# Patient Record
Sex: Female | Born: 1965 | Race: Black or African American | Hispanic: No | Marital: Married | Smoking: Never smoker
Health system: Southern US, Community
[De-identification: ages and names within clinical notes are randomized; demographics above are authoritative.]

## PROBLEM LIST (undated history)

## (undated) DIAGNOSIS — I714 Abdominal aortic aneurysm, without rupture, unspecified: Secondary | ICD-10-CM

## (undated) DIAGNOSIS — I10 Essential (primary) hypertension: Secondary | ICD-10-CM

---

## 2014-08-02 ENCOUNTER — Encounter (HOSPITAL_BASED_OUTPATIENT_CLINIC_OR_DEPARTMENT_OTHER): Payer: Self-pay | Admitting: *Deleted

## 2014-08-02 ENCOUNTER — Emergency Department (HOSPITAL_BASED_OUTPATIENT_CLINIC_OR_DEPARTMENT_OTHER)
Admission: EM | Admit: 2014-08-02 | Discharge: 2014-08-02 | Disposition: A | Discharge status: Home / Self Care | Payer: Self-pay | Attending: Emergency Medicine | Admitting: Emergency Medicine

## 2014-08-02 DIAGNOSIS — R1030 Lower abdominal pain, unspecified: Secondary | ICD-10-CM

## 2014-08-02 DIAGNOSIS — M791 Myalgia: Secondary | ICD-10-CM | POA: Insufficient documentation

## 2014-08-02 DIAGNOSIS — R1031 Right lower quadrant pain: Secondary | ICD-10-CM | POA: Insufficient documentation

## 2014-08-02 DIAGNOSIS — I10 Essential (primary) hypertension: Secondary | ICD-10-CM | POA: Insufficient documentation

## 2014-08-02 HISTORY — DX: Essential (primary) hypertension: I10

## 2014-08-02 NOTE — ED Provider Notes (Signed)
CSN: [id number]     Arrival date & time 08/02/14  1648 History  This chart was scribed for Vanetta MuldersScott Calyn Rubi, MD by Roxy Cedarhandni Bhalodia, ED Scribe. This patient was seen in room MH05/MH05 and the patient's care was started at 5:30 PM.   Chief Complaint  Patient presents with  . Abdominal Pain   Patient is a 48 y.o. female presenting with abdominal pain. The history is provided by the patient. No language interpreter was used.  Abdominal Pain Pain location:  RLQ Pain quality: burning and sharp   Pain radiates to:  Does not radiate Pain severity:  Moderate Onset quality:  Gradual Duration:  1 hour Timing:  Constant Progression:  Unchanged Chronicity:  New Relieved by:  Nothing Worsened by:  Nothing tried Ineffective treatments:  None tried Associated symptoms: no chest pain, no chills, no diarrhea, no dysuria, no fever, no nausea, no shortness of breath, no sore throat and no vomiting    HPI Comments:  Kathleen Drake is a 48 y.o. female with a history of hypertension, brought in by parents to the Emergency Department complaining of sharp and burning RLQ abdominal pain that began 1 hour ago while outside watching a parade. She denies associated nausea, vomiting, diarrhea. She states that her symptoms have gradually improved since arriving to ER. She states that the pain began at 4:20pm and lasted until 4:45pm. She denies history of similar symptoms. She rates her pain at worst 10/10.  She states that the pain worsens with cough.  Past Medical History  Diagnosis Date  . Hypertension    History reviewed. No pertinent past surgical history. No family history on file. History  Substance Use Topics  . Smoking status: Never Smoker   . Smokeless tobacco: Not on file  . Alcohol Use: Yes   OB History    No data available     Review of Systems  Constitutional: Negative for fever and chills.  HENT: Negative for congestion, rhinorrhea and sore throat.   Eyes: Negative for visual  disturbance.  Respiratory: Negative for shortness of breath.   Cardiovascular: Negative for chest pain and leg swelling.  Gastrointestinal: Positive for abdominal pain. Negative for nausea, vomiting and diarrhea.  Genitourinary: Negative for dysuria.  Musculoskeletal: Positive for myalgias.  Neurological: Negative for headaches.  Hematological: Does not bruise/bleed easily.  Psychiatric/Behavioral: Negative for confusion.   Allergies  Review of patient's allergies indicates not on file.  Home Medications   Prior to Admission medications   Not on File   Triage Vitals: BP 141/93 mmHg  Pulse 66  Temp(Src) 97.9 F (36.6 C)  Resp 18  Ht 5\' 2"  (1.575 m)  Wt 155 lb (70.308 kg)  BMI 28.34 kg/m2  SpO2 100%  LMP 07/30/2014  Physical Exam  Constitutional: She is oriented to person, place, and time. She appears well-developed and well-nourished.  HENT:  Head: Normocephalic and atraumatic.  Mouth/Throat: Oropharynx is clear and moist.  Eyes: Pupils are equal, round, and reactive to light.  Cardiovascular: Normal rate, regular rhythm and normal heart sounds.   No murmur heard. Pulmonary/Chest: Effort normal and breath sounds normal. No respiratory distress.  Abdominal: Soft. Bowel sounds are normal. There is no tenderness.  RLQ abdominal pain  Musculoskeletal: She exhibits no edema.  Neurological: She is alert and oriented to person, place, and time. No cranial nerve deficit. She exhibits normal muscle tone. Coordination normal.  Psychiatric: She has a normal mood and affect.  Nursing note and vitals reviewed.  ED Course  Procedures (  including critical care time)  DIAGNOSTIC STUDIES: Oxygen Saturation is 100% on RA, normal by my interpretation.    COORDINATION OF CARE: 5:37 PM- Discussed plans to discharge. Advised patient to return if symptoms worsen and last for more than 1-2 hours. Pt advised of plan for treatment and pt agrees.  MDM   Final diagnoses:  Lower abdominal  pain    Patient with less than 45 minutes of right lower quadrant abdominal pain. It was intense now completely resolved. Examination nontender in the abdomen. No lab workup required. I guess it's possible that this could've been renal colic type pain. Patient may pass the stone or stopped moving. Patient will return for any recurrent pain. No workup required. Patient is nontoxic no acute distress.  I personally performed the services described in this documentation, which was scribed in my presence. The recorded information has been reviewed and is accurate.  Vanetta MuldersScott Jamariya Davidoff, MD 08/02/14 1742

## 2014-08-02 NOTE — ED Notes (Signed)
Reports sudden onset of RLQ abd pain at approx 1620 while outside watching parade. States pain made her double over- Pain resolved at this time- denies n/v/d

## 2014-08-02 NOTE — Discharge Instructions (Signed)
We are glad that the pain is resolved. Return for any recurrent pain lasting an hour or 2. Return for any new or worse symptoms.

## 2014-08-02 NOTE — ED Notes (Signed)
MD at bedside. 

## 2014-08-02 NOTE — ED Notes (Signed)
Pt reports onset of lower right abdominal pain about 1 hour, denies n/v/d.

## 2016-04-16 ENCOUNTER — Encounter (HOSPITAL_BASED_OUTPATIENT_CLINIC_OR_DEPARTMENT_OTHER): Payer: Self-pay | Admitting: *Deleted

## 2016-04-16 ENCOUNTER — Emergency Department (HOSPITAL_BASED_OUTPATIENT_CLINIC_OR_DEPARTMENT_OTHER)
Admission: EM | Admit: 2016-04-16 | Discharge: 2016-04-16 | Disposition: A | Discharge status: Home / Self Care | Payer: Self-pay | Attending: Emergency Medicine | Admitting: Emergency Medicine

## 2016-04-16 ENCOUNTER — Emergency Department (HOSPITAL_BASED_OUTPATIENT_CLINIC_OR_DEPARTMENT_OTHER): Payer: Self-pay

## 2016-04-16 DIAGNOSIS — R350 Frequency of micturition: Secondary | ICD-10-CM | POA: Insufficient documentation

## 2016-04-16 DIAGNOSIS — I10 Essential (primary) hypertension: Secondary | ICD-10-CM | POA: Insufficient documentation

## 2016-04-16 DIAGNOSIS — R1031 Right lower quadrant pain: Secondary | ICD-10-CM

## 2016-04-16 DIAGNOSIS — N730 Acute parametritis and pelvic cellulitis: Secondary | ICD-10-CM | POA: Insufficient documentation

## 2016-04-16 DIAGNOSIS — N7093 Salpingitis and oophoritis, unspecified: Secondary | ICD-10-CM | POA: Insufficient documentation

## 2016-04-16 DIAGNOSIS — R300 Dysuria: Secondary | ICD-10-CM | POA: Insufficient documentation

## 2016-04-16 DIAGNOSIS — R102 Pelvic and perineal pain: Secondary | ICD-10-CM

## 2016-04-16 LAB — URINALYSIS, ROUTINE W REFLEX MICROSCOPIC
Bilirubin Urine: NEGATIVE
GLUCOSE, UA: NEGATIVE mg/dL
HGB URINE DIPSTICK: NEGATIVE
Ketones, ur: 15 mg/dL — AB
Leukocytes, UA: NEGATIVE
Nitrite: NEGATIVE
PH: 5.5 (ref 5.0–8.0)
PROTEIN: NEGATIVE mg/dL
SPECIFIC GRAVITY, URINE: 1.027 (ref 1.005–1.030)

## 2016-04-16 LAB — COMPREHENSIVE METABOLIC PANEL
ALT: 9 U/L — ABNORMAL LOW (ref 14–54)
AST: 13 U/L — ABNORMAL LOW (ref 15–41)
Albumin: 3.5 g/dL (ref 3.5–5.0)
Alkaline Phosphatase: 65 U/L (ref 38–126)
Anion gap: 6 (ref 5–15)
BUN: 12 mg/dL (ref 6–20)
CO2: 28 mmol/L (ref 22–32)
Calcium: 8.8 mg/dL — ABNORMAL LOW (ref 8.9–10.3)
Chloride: 103 mmol/L (ref 101–111)
Creatinine, Ser: 0.98 mg/dL (ref 0.44–1.00)
GFR calc Af Amer: >60 mL/min (ref >60)
GFR calc non Af Amer: >60 mL/min (ref >60)
Glucose, Bld: 93 mg/dL (ref 65–99)
Potassium: 3.6 mmol/L (ref 3.5–5.1)
Sodium: 137 mmol/L (ref 135–145)
Total Bilirubin: 0.8 mg/dL (ref 0.3–1.2)
Total Protein: 6.9 g/dL (ref 6.5–8.1)

## 2016-04-16 LAB — CBC WITH DIFFERENTIAL/PLATELET
Basophils Absolute: 0 10*3/uL (ref 0.0–0.1)
Basophils Relative: 0 %
Eosinophils Absolute: 0.2 10*3/uL (ref 0.0–0.7)
Eosinophils Relative: 1 %
HCT: 38.5 % (ref 36.0–46.0)
Hemoglobin: 12.4 g/dL (ref 12.0–15.0)
Lymphocytes Relative: 17 %
Lymphs Abs: 2.4 10*3/uL (ref 0.7–4.0)
MCH: 28.1 pg (ref 26.0–34.0)
MCHC: 32.2 g/dL (ref 30.0–36.0)
MCV: 87.1 fL (ref 78.0–100.0)
Monocytes Absolute: 1.5 10*3/uL — ABNORMAL HIGH (ref 0.1–1.0)
Monocytes Relative: 11 %
Neutro Abs: 9.9 10*3/uL — ABNORMAL HIGH (ref 1.7–7.7)
Neutrophils Relative %: 71 %
Platelets: 331 10*3/uL (ref 150–400)
RBC: 4.42 MIL/uL (ref 3.87–5.11)
RDW: 14.5 % (ref 11.5–15.5)
WBC: 14 10*3/uL — ABNORMAL HIGH (ref 4.0–10.5)

## 2016-04-16 LAB — WET PREP, GENITAL
Sperm: NONE SEEN
Trich, Wet Prep: NONE SEEN
Yeast Wet Prep HPF POC: NONE SEEN

## 2016-04-16 MED ORDER — CEFTRIAXONE SODIUM 1 G IJ SOLR
1.0000 g | Freq: Once | INTRAMUSCULAR | Status: AC
  Administered 2016-04-16: 1 g via INTRAMUSCULAR
  Filled 2016-04-16: qty 10

## 2016-04-16 MED ORDER — CEFTRIAXONE SODIUM 250 MG IJ SOLR
250.0000 mg | Freq: Once | INTRAMUSCULAR | Status: AC
  Administered 2016-04-16: 250 mg via INTRAMUSCULAR
  Filled 2016-04-16: qty 250

## 2016-04-16 MED ORDER — METRONIDAZOLE 500 MG PO TABS: 500.0000 mg | ORAL_TABLET | Freq: Two times a day (BID) | ORAL | 0 refills | Status: DC

## 2016-04-16 MED ORDER — IOPAMIDOL (ISOVUE-300) INJECTION 61%
100.0000 mL | Freq: Once | INTRAVENOUS | Status: AC | PRN
  Administered 2016-04-16: 100 mL via INTRAVENOUS

## 2016-04-16 MED ORDER — DOXYCYCLINE HYCLATE 100 MG PO CAPS: 100.0000 mg | ORAL_CAPSULE | Freq: Two times a day (BID) | ORAL | 0 refills | Status: DC

## 2016-04-16 MED ORDER — LIDOCAINE HCL (PF) 1 % IJ SOLN
INTRAMUSCULAR | Status: AC
  Administered 2016-04-16: 2.1 mL
  Filled 2016-04-16: qty 5

## 2016-04-16 MED ORDER — CIPROFLOXACIN HCL 500 MG PO TABS: 500.0000 mg | ORAL_TABLET | Freq: Two times a day (BID) | ORAL | 0 refills | Status: DC

## 2016-04-16 MED ORDER — LIDOCAINE HCL (PF) 1 % IJ SOLN
INTRAMUSCULAR | Status: AC
  Administered 2016-04-16: 0.9 mL
  Filled 2016-04-16: qty 5

## 2016-04-16 NOTE — ED Provider Notes (Signed)
[facility]   CSN: [id number] Arrival date & time: 04/16/16  1132  First Provider Contact:  First MD Initiated Contact with Patient 04/16/16 1158        History   Chief Complaint Chief Complaint  Patient presents with  . Urinary Tract Infection    HPI Kathleen Drake is a 50 y.o. female who presents with a 1 day history of urinary frequency, urgency, suprapubic pain, dysuria, dyspareunia, right lower quadrant and periumbilical pain. Patient reports her abdominal pain as a constant dull, burning sensation with intermittent sharp pains. Patient states her symptoms again around noon yesterday and gradually worsened. Patient did not take any medications at home for this. Patient is sexually active with her husband. She does not have any specifics concern for STD, however she states it is not out of the question. Patient denies any fevers, chest pain, shortness of breath, nausea, vomiting, hematuria. Patient is in menopause.  HPI  Past Medical History:  Diagnosis Date  . Hypertension    Pt denies, is not treated    There are no active problems to display for this patient.   History reviewed. No pertinent surgical history.  OB History    No data available       Home Medications    Prior to Admission medications   Not on File    Family History No family history on file.  Social History Social History  Substance Use Topics  . Smoking status: Never Smoker  . Smokeless tobacco: Never Used  . Alcohol use Yes     Allergies   Review of patient's allergies indicates no known allergies.   Review of Systems Review of Systems  Constitutional: Negative for chills and fever.  HENT: Negative for facial swelling.   Respiratory: Negative for shortness of breath.   Cardiovascular: Negative for chest pain.  Gastrointestinal: Positive for abdominal pain. Negative for blood in stool, nausea and vomiting.  Genitourinary: Positive for dyspareunia,  dysuria, frequency and urgency. Negative for hematuria, vaginal bleeding and vaginal discharge.  Musculoskeletal: Negative for back pain.  Skin: Negative for rash and wound.  Neurological: Negative for headaches.  Psychiatric/Behavioral: The patient is not nervous/anxious.      Physical Exam Updated Vital Signs BP 126/93   Pulse 75   Temp 98.4 F (36.9 C) (Oral)   Resp 18   Ht  (1.575 m)   Wt 74.8 kg   LMP 07/30/2014   SpO2 100%   BMI 30.18 kg/m   Physical Exam  Constitutional: She appears well-developed and well-nourished. No distress.  HENT:  Head: Normocephalic and atraumatic.  Mouth/Throat: Oropharynx is clear and moist. No oropharyngeal exudate.  Eyes: Conjunctivae are normal. Pupils are equal, round, and reactive to light. Right eye exhibits no discharge. Left eye exhibits no discharge. No scleral icterus.  Neck: Normal range of motion. Neck supple. No thyromegaly present.  Cardiovascular: Normal rate, regular rhythm, normal heart sounds and intact distal pulses.  Exam reveals no gallop and no friction rub.   No murmur heard. Pulmonary/Chest: Effort normal and breath sounds normal. No stridor. No respiratory distress. She has no wheezes. She has no rales.  Abdominal: Soft. Bowel sounds are normal. She exhibits no distension. There is tenderness in the right lower quadrant, periumbilical area and suprapubic area. There is tenderness at McBurney's point. There is no rebound, no guarding, no CVA tenderness and negative Murphy's sign.    Genitourinary: Pelvic exam was performed with patient prone. There is no  rash, tenderness or lesion on the right labia. There is no rash, tenderness or lesion on the left labia. Cervix exhibits motion tenderness and discharge (scant). Right adnexum displays tenderness. Right adnexum displays no mass and no fullness. Left adnexum displays no mass, no tenderness and no fullness. There is tenderness in the vagina. No bleeding in the vagina.  Vaginal discharge (scant) found.  Genitourinary Comments: Chaperone present; pain with speculum insertion  Musculoskeletal: She exhibits no edema.  Lymphadenopathy:    She has no cervical adenopathy.  Neurological: She is alert. Coordination normal.  Skin: Skin is warm and dry. No rash noted. She is not diaphoretic. No pallor.  Psychiatric: She has a normal mood and affect.  Nursing note and vitals reviewed.    ED Treatments / Results  Labs (all labs ordered are listed, but only abnormal results are displayed) Labs Reviewed  WET PREP, GENITAL - Abnormal; Notable for the following:       Result Value   Clue Cells Wet Prep HPF POC PRESENT (*)    WBC, Wet Prep HPF POC MODERATE (*)    All other components within normal limits  URINALYSIS, ROUTINE W REFLEX MICROSCOPIC (NOT AT [facility]) - Abnormal; Notable for the following:    Color, Urine AMBER (*)    APPearance CLOUDY (*)    Ketones, ur 15 (*)    All other components within normal limits  COMPREHENSIVE METABOLIC PANEL - Abnormal; Notable for the following:    Calcium 8.8 (*)    AST 13 (*)    ALT 9 (*)    All other components within normal limits  CBC WITH DIFFERENTIAL/PLATELET - Abnormal; Notable for the following:    WBC 14.0 (*)    Neutro Abs 9.9 (*)    Monocytes Absolute 1.5 (*)    All other components within normal limits  URINE CULTURE  RPR  HIV ANTIBODY (ROUTINE TESTING)  GC/CHLAMYDIA PROBE AMP ([place]) NOT AT [facility]    EKG  EKG Interpretation None       Radiology Ct Abdomen Pelvis W Contrast  Result Date: 04/16/2016 CLINICAL DATA:  Right lower quadrant pain with nausea. EXAM: CT ABDOMEN AND PELVIS WITH CONTRAST TECHNIQUE: Multidetector CT imaging of the abdomen and pelvis was performed using the standard protocol following bolus administration of intravenous contrast. CONTRAST:  ISOVUE-300 IOPAMIDOL (ISOVUE-300) INJECTION 61% COMPARISON:  None. FINDINGS: There is a 4 mm nodule in the right lung on series 5,  image 4. A smaller nodule is suspected on series 5, image 5 on the left. The lung bases are otherwise within normal limits. No free air.  There is a small amount of free fluid in the pelvis. The gallbladder is decompressed limiting evaluation. A low-attenuation lesion in the right hepatic lobe is almost certainly a cyst but too small to characterize. The liver and portal vein are otherwise normal. The spleen, adrenal glands, and pancreas are normal. The kidneys demonstrate no abnormalities. The abdominal aorta is non aneurysmal with no atherosclerotic change. No adenopathy. The stomach and small bowel are normal. The colon and appendix are normal. Probable fibroids in the uterus. A high attenuation mass projecting posteriorly off the uterus on series 3, image 76 is likely a fibroid. Several small follicles in the left ovary. There is some fluid in the pelvis. No definitive right ovarian mass. Visualized bones are unremarkable. IMPRESSION: 1. There is a small amount of fluid in the pelvis, likely physiologic. It is possible the patient's pain is adnexal in origin.  If there is concern, an ultrasound could better evaluate as clinically warranted. 2. Probable fibroid uterus. 3. The appendix is normal. No other cause for right lower quadrant pain is identified. 4. 4 mm nodule in the right lung. See below for follow-up recommendations. No follow-up needed if patient is low-risk. Non-contrast chest CT can be considered in 12 months if patient is high-risk. This recommendation follows the consensus statement: Guidelines for Management of Incidental Pulmonary Nodules Detected on CT Images:From the Fleischner Society 2017; published online before print (10.1148/[id number]). Electronically Signed   By: Gerome Samavid  Williams III M.D   On: 04/16/2016 16:00    Procedures Procedures (including critical care time)  Medications Ordered in ED Medications  cefTRIAXone (ROCEPHIN) injection 250 mg (not administered)  lidocaine  (PF) (XYLOCAINE) 1 % injection (not administered)  iopamidol (ISOVUE-300) 61 % injection 100 mL (100 mLs Intravenous Contrast Given 04/16/16 1528)     Initial Impression / Assessment and Plan / ED Course  I have reviewed the triage vital signs and the nursing notes.  Pertinent labs & imaging results that were available during my care of the patient were reviewed by me and considered in my medical decision making (see chart for details).  Clinical Course    CT abdomen pelvis ordered due to positive Rovsing's and right lower quadrant pain, elevated white blood cell count. Pelvic ultrasound suggested by radiologist and will order a pelvic ultrasound.  Final Clinical Impressions(s) / ED Diagnoses   Final diagnoses:  Urinary frequency  Suprapubic pain  Right adnexal tenderness    CBC shows WBC 14; neutrophil 9.9. CMP shows AST 13, ALT 9. UA negative for infection, 15 ketones. Urine culture sent and would treat if culture positive. Wet prep shows moderate white blood cells, clue cells. CT abdomen and pelvis shows [IMPRESSION: 1. There is a small amount of fluid in the pelvis, likely physiologic. It is possible the patient's pain is adnexal in origin. If there is concern, an ultrasound could better evaluate as clinically warranted. 2. Probable fibroid uterus. 3. The appendix is normal. No other cause for right lower quadrant pain is identified. 4. 4 mm nodule in the right lung. See below for follow-up recommendations. No follow-up needed if patient is low-risk. Non-contrast chest CT can be considered in 12 months if patient is high-risk.] Patient never smoker indicating patient is low risk. Pelvic ultrasound pending. Due to patient pelvic exam, I will treat patient for PID. Patient given Rocephin in ED. Patient to be discharged with doxycycline. At shift change, [facility] Laisure, PA-C for continued evaluation, follow up of pelvic ultrasound and determination of disposition.  Patient to follow-up with OB/GYN for further evaluation and follow-up. Patient has an appointment this month as scheduled. Patient afebrile, vital stable throughout ED course. I discussed this case with Dr. Fredderick PhenixBelfi who noted the patient's management and is in agreement with plan. Patient understands and agrees with plan.    New Prescriptions New Prescriptions   No medications on file     Emi Holeslexandra M Rafaella Kole, PA-C 04/16/16 1754    Rolan BuccoMelanie Belfi, MD 04/17/16 0700

## 2016-04-16 NOTE — ED Notes (Signed)
PA at bedside.

## 2016-04-16 NOTE — ED Notes (Signed)
Patient transported to Ultrasound 

## 2016-04-16 NOTE — Discharge Instructions (Addendum)
Medications: Cipro and Flagyl  Treatment: You are being treated for suspected pelvic inflammatory disease. Take doxycycline twice daily for 2 weeks. You will be called in 2-3 days if any of your results return positive. If your positive for HIV or syphilis, please seek treatment of your OB/GYN or health department. You will also be called in 2-3 days if your urine culture is positive. Treatment will be arranged at that time.  Follow-up: Please follow-up with your OB/GYN as soon as possible for follow-up and further evaluation and treatment. Please return to emergency department if he develop any new or worsening symptoms.

## 2016-04-16 NOTE — ED Triage Notes (Signed)
Patient states she has a two day history of urinary urgency, frequency, and stinging with urination. Also has pain in the LLQ and umbilicus area as well.

## 2016-04-16 NOTE — ED Provider Notes (Signed)
5:00 PM Patient signed out to me by Buel ReamAlexandra Law, PA-C.  Patient presents today with RLQ abdominal pain.  CT was ordered to rule out an Appendicitis.  CT negative for Appendicitis, but did show changes consistent with PID.  Pelvic ultrasound ordered.  Results pending.    6:30 PM Pelvic ultrasound showing PID and Pyosalpinx bilaterally.  Will consult OB/GYN  8:00 PM Discussed with Dr Despina HiddenEure with OB/GYN.  He recommends giving the patient a gram IM Rocephin and Cipro and Flagyl 500 mg BID x 14 days.    Patient given prescriptions and IM Rocephin and instructed to follow up with OB/GYN outpatient.      Santiago GladHeather Norely Schlick, PA-C 04/18/16 2145    Lavera Guiseana Duo Liu, MD 04/19/16 [phone]

## 2016-04-16 NOTE — ED Notes (Signed)
Patient transported to CT 

## 2016-04-17 LAB — URINE CULTURE

## 2016-04-17 LAB — RPR: RPR Ser Ql: NONREACTIVE

## 2016-04-17 LAB — HIV ANTIBODY (ROUTINE TESTING W REFLEX): HIV Screen 4th Generation wRfx: NONREACTIVE

## 2016-04-17 LAB — GC/CHLAMYDIA PROBE AMP (~~LOC~~) NOT AT ARMC
Chlamydia: NEGATIVE
Neisseria Gonorrhea: NEGATIVE

## 2016-11-05 ENCOUNTER — Encounter (HOSPITAL_BASED_OUTPATIENT_CLINIC_OR_DEPARTMENT_OTHER): Payer: Self-pay | Admitting: Emergency Medicine

## 2016-11-05 ENCOUNTER — Emergency Department (HOSPITAL_BASED_OUTPATIENT_CLINIC_OR_DEPARTMENT_OTHER)
Admission: EM | Admit: 2016-11-05 | Discharge: 2016-11-05 | Disposition: A | Discharge status: Home / Self Care | Payer: Self-pay | Attending: Emergency Medicine | Admitting: Emergency Medicine

## 2016-11-05 ENCOUNTER — Emergency Department (HOSPITAL_BASED_OUTPATIENT_CLINIC_OR_DEPARTMENT_OTHER): Payer: Self-pay

## 2016-11-05 DIAGNOSIS — Z79899 Other long term (current) drug therapy: Secondary | ICD-10-CM | POA: Insufficient documentation

## 2016-11-05 DIAGNOSIS — Y939 Activity, unspecified: Secondary | ICD-10-CM | POA: Insufficient documentation

## 2016-11-05 DIAGNOSIS — I10 Essential (primary) hypertension: Secondary | ICD-10-CM | POA: Insufficient documentation

## 2016-11-05 DIAGNOSIS — M7021 Olecranon bursitis, right elbow: Secondary | ICD-10-CM | POA: Insufficient documentation

## 2016-11-05 MED ORDER — DICLOFENAC SODIUM 1 % TD GEL: 2.0000 g | Freq: Four times a day (QID) | TRANSDERMAL | 0 refills | Status: DC

## 2016-11-05 MED ORDER — IBUPROFEN 400 MG PO TABS
600.0000 mg | ORAL_TABLET | Freq: Once | ORAL | Status: AC
  Administered 2016-11-05: 600 mg via ORAL
  Filled 2016-11-05: qty 1

## 2016-11-05 MED ORDER — HYDROCODONE-ACETAMINOPHEN 5-325 MG PO TABS: 1.0000 | ORAL_TABLET | Freq: Four times a day (QID) | ORAL | 0 refills | Status: DC | PRN

## 2016-11-05 NOTE — ED Provider Notes (Signed)
[facility] DEPT Provider Note   CSN: [id number] Arrival date & time: 11/05/16  1112     History   Chief Complaint Chief Complaint  Patient presents with  . Insect Bite    HPI Kathleen Drake is a 51 y.o. female who presents to the ED with swelling of the right elbow that started a few days ago as a single tinder spot and this morning had swelling and increased tenderness to the elbow. Patient reports she first noted the tenderness when she was sitting propped on her elbow a few days ago. She does not remember any injury to the area. She thought at first maybe something bit her. No fever, n/v or other problems.  HPI  Past Medical History:  Diagnosis Date  . Hypertension    Pt denies, is not treated    There are no active problems to display for this patient.   History reviewed. No pertinent surgical history.  OB History    No data available       Home Medications    Prior to Admission medications   Medication Sig Start Date End Date Taking? Authorizing Provider  ciprofloxacin (CIPRO) 500 MG tablet Take 1 tablet (500 mg total) by mouth 2 (two) times daily. 04/16/16   Heather Laisure, PA-C  diclofenac sodium (VOLTAREN) 1 % GEL Apply 2 g topically 4 (four) times daily. 11/05/16   Erandi Lemma Orlene Och, NP  doxycycline (VIBRAMYCIN) 100 MG capsule Take 1 capsule (100 mg total) by mouth 2 (two) times daily. 04/16/16   Emi Holes, PA-C  HYDROcodone-acetaminophen (NORCO/VICODIN) 5-325 MG tablet Take 1 tablet by mouth every 6 (six) hours as needed. 11/05/16   Clela Hagadorn Orlene Och, NP  metroNIDAZOLE (FLAGYL) 500 MG tablet Take 1 tablet (500 mg total) by mouth 2 (two) times daily. 04/16/16   Santiago Glad, PA-C    Family History No family history on file.  Social History Social History  Substance Use Topics  . Smoking status: Never Smoker  . Smokeless tobacco: Never Used  . Alcohol use Yes     Comment: socially     Allergies   Patient has no known allergies.   Review of  Systems Review of Systems Negative except as stated in HPI  Physical Exam Updated Vital Signs BP (!) 144/108 (BP Location: Right Arm) Comment: pt has not taken BP meds today-will take when she gets home.  Pulse 68   Temp 98 F (36.7 C) (Oral)   Resp 18   Ht 5\' 2"  (1.575 m)   Wt 76.7 kg   LMP 07/30/2014   SpO2 100%   BMI 30.91 kg/m   Physical Exam  Constitutional: She appears well-developed and well-nourished. No distress.  HENT:  Head: Normocephalic.  Eyes: EOM are normal.  Neck: Neck supple.  Cardiovascular: Normal rate.   Pulmonary/Chest: Effort normal.  Abdominal: Soft. There is no tenderness.  Musculoskeletal:       Right elbow: She exhibits swelling. She exhibits no laceration. Tenderness found. Olecranon process tenderness noted.  There is swelling and tenderness over the Olecranon process. Radial pulse 2+, adequate circulation.   Neurological: She is alert.  Skin: Skin is warm and dry.  Psychiatric: She has a normal mood and affect. Her behavior is normal.  Nursing note and vitals reviewed.    ED Treatments / Results  Labs (all labs ordered are listed, but only abnormal results are displayed) Labs Reviewed - No data to display   Radiology Dg Elbow Complete Right  Result Date:  11/05/2016 CLINICAL DATA:  Elbow pain.  No known injury. EXAM: RIGHT ELBOW - COMPLETE 3+ VIEW COMPARISON:  None. FINDINGS: There is no acute fracture or subluxation. There is no joint effusion. Spurring at the coronoid process is identified. IMPRESSION: No acute findings. Mild degenerative change. Electronically Signed   By: Signa Kellaylor  Stroud M.D.   On: 11/05/2016 13:53    Procedures Procedures (including critical care time)  Medications Ordered in ED Medications  ibuprofen (ADVIL,MOTRIN) tablet 600 mg (600 mg Oral Given 11/05/16 1328)     Initial Impression / Assessment and Plan / ED Course  I have reviewed the triage vital signs and the nursing notes.  Pertinent  imaging results  that were available during my care of the patient were reviewed by me and considered in my medical decision making (see chart for details).   Final Clinical Impressions(s) / ED Diagnoses  51 y.o. female with swelling and tenderness of the right elbow stable for d/c without fracture or dislocation on x-ray. Will treat for bursitis and she will f/u with ortho if symptoms persist.  Final diagnoses:  Olecranon bursitis of right elbow    New Prescriptions Discharge Medication List as of 11/05/2016  2:10 PM    START taking these medications   Details  diclofenac sodium (VOLTAREN) 1 % GEL Apply 2 g topically 4 (four) times daily., Starting Sun 11/05/2016, Print    HYDROcodone-acetaminophen (NORCO/VICODIN) 5-325 MG tablet Take 1 tablet by mouth every 6 (six) hours as needed., Starting Sun 11/05/2016, Print         [name], NP 11/06/16 2053    Arby BarretteMarcy Pfeiffer, MD 11/15/16 [phone]

## 2016-11-05 NOTE — ED Notes (Signed)
Called x1 in waiting room no answer 

## 2016-11-05 NOTE — ED Notes (Signed)
Pt updated on the wait.

## 2016-11-05 NOTE — Discharge Instructions (Signed)
Wear the ace wrap to help with swelling, apply the cream for inflammation and take the pain medication as directed. Do not drive when taking the narcotic as it will make you sleepy. Follow up with your doctor or return here as needed.

## 2017-04-20 ENCOUNTER — Emergency Department (HOSPITAL_BASED_OUTPATIENT_CLINIC_OR_DEPARTMENT_OTHER)
Admission: EM | Admit: 2017-04-20 | Discharge: 2017-04-20 | Disposition: A | Discharge status: Home / Self Care | Payer: Self-pay | Attending: Emergency Medicine | Admitting: Emergency Medicine

## 2017-04-20 ENCOUNTER — Encounter (HOSPITAL_BASED_OUTPATIENT_CLINIC_OR_DEPARTMENT_OTHER): Payer: Self-pay | Admitting: *Deleted

## 2017-04-20 DIAGNOSIS — J020 Streptococcal pharyngitis: Secondary | ICD-10-CM | POA: Insufficient documentation

## 2017-04-20 DIAGNOSIS — I10 Essential (primary) hypertension: Secondary | ICD-10-CM | POA: Insufficient documentation

## 2017-04-20 LAB — RAPID STREP SCREEN (MED CTR MEBANE ONLY): Streptococcus, Group A Screen (Direct): POSITIVE — AB

## 2017-04-20 MED ORDER — ACETAMINOPHEN 325 MG PO TABS
650.0000 mg | ORAL_TABLET | Freq: Once | ORAL | Status: AC
  Administered 2017-04-20: 650 mg via ORAL
  Filled 2017-04-20: qty 2

## 2017-04-20 MED ORDER — DEXAMETHASONE SODIUM PHOSPHATE 10 MG/ML IJ SOLN
10.0000 mg | Freq: Once | INTRAMUSCULAR | Status: AC
  Administered 2017-04-20: 10 mg via INTRAMUSCULAR
  Filled 2017-04-20: qty 1

## 2017-04-20 MED ORDER — IBUPROFEN 800 MG PO TABS
800.0000 mg | ORAL_TABLET | Freq: Once | ORAL | Status: AC
  Administered 2017-04-20: 800 mg via ORAL
  Filled 2017-04-20: qty 1

## 2017-04-20 MED ORDER — IBUPROFEN 600 MG PO TABS
600.0000 mg | ORAL_TABLET | Freq: Four times a day (QID) | ORAL | 0 refills | Status: AC | PRN
Start: 2017-04-20 — End: ?

## 2017-04-20 MED ORDER — HYDROCODONE-ACETAMINOPHEN 5-325 MG PO TABS: 1.0000 | ORAL_TABLET | ORAL | 0 refills | Status: AC | PRN

## 2017-04-20 MED ORDER — PENICILLIN G BENZATHINE 1200000 UNIT/2ML IM SUSP
1.2000 10*6.[IU] | Freq: Once | INTRAMUSCULAR | Status: AC
Start: 2017-04-20 — End: 2017-04-20
  Administered 2017-04-20: 1.2 10*6.[IU] via INTRAMUSCULAR
  Filled 2017-04-20: qty 2

## 2017-04-20 NOTE — ED Triage Notes (Signed)
Sore throat, fever, left sided CP radiating down her arm x 1 week and HA.

## 2017-04-20 NOTE — ED Notes (Signed)
Pt teaching provided on medications that may cause drowsiness. Pt instructed not to drive or operate heavy machinery while taking the prescribed medication. Pt verbalized understanding.   

## 2017-04-20 NOTE — ED Provider Notes (Signed)
[facility] Provider Note   CSN: [id number] Arrival date & time: 04/20/17  2104     History   Chief Complaint Chief Complaint  Patient presents with  . Sore Throat    HPI Kathleen Drake is a 51 y.o. female.  Pt presents to the ED today with a sore throat.  She was exposed to a niece who had strep.  She has had a fever.  She did not take any meds for fever or for pain.      Past Medical History:  Diagnosis Date  . Hypertension    Pt denies, is not treated    There are no active problems to display for this patient.   History reviewed. No pertinent surgical history.  OB History    No data available       Home Medications    Prior to Admission medications   Medication Sig Start Date End Date Taking? Authorizing Provider  HYDROcodone-acetaminophen (NORCO/VICODIN) 5-325 MG tablet Take 1 tablet by mouth every 4 (four) hours as needed. 04/20/17   Jacalyn LefevreHaviland, Lyndi Holbein, MD  ibuprofen (ADVIL,MOTRIN) 600 MG tablet Take 1 tablet (600 mg total) by mouth every 6 (six) hours as needed. 04/20/17   Jacalyn LefevreHaviland, Tamsen Reist, MD    Family History History reviewed. No pertinent family history.  Social History Social History  Substance Use Topics  . Smoking status: Never Smoker  . Smokeless tobacco: Never Used  . Alcohol use Yes     Comment: socially     Allergies   Patient has no known allergies.   Review of Systems Review of Systems  Constitutional: Positive for fever.  HENT: Positive for sore throat.   All other systems reviewed and are negative.    Physical Exam Updated Vital Signs BP (!) 148/110 (BP Location: Left Arm)   Pulse (!) 104   Temp (!) 100.6 F (38.1 C) (Oral)   Resp 18   Ht 5\' 2"  (1.575 m)   Wt 78 kg (172 lb)   LMP 07/30/2014   SpO2 96%   BMI 31.46 kg/m   Physical Exam  Constitutional: She is oriented to person, place, and time. She appears well-developed. She appears distressed.  HENT:  Head: Normocephalic and atraumatic.  Right  Ear: External ear normal.  Left Ear: External ear normal.  Nose: Nose normal.  Mouth/Throat: Oropharyngeal exudate and posterior oropharyngeal erythema present.  Eyes: Pupils are equal, round, and reactive to light. Conjunctivae and EOM are normal.  Neck: Normal range of motion. Neck supple.  Cardiovascular: Regular rhythm, normal heart sounds and intact distal pulses.  Tachycardia present.   Pulmonary/Chest: Effort normal and breath sounds normal.  Abdominal: Soft. Bowel sounds are normal.  Musculoskeletal: Normal range of motion.  Neurological: She is alert and oriented to person, place, and time.  Skin: Skin is warm. Capillary refill takes less than 2 seconds.  Psychiatric: She has a normal mood and affect. Her behavior is normal. Judgment and thought content normal.  Nursing note and vitals reviewed.    ED Treatments / Results  Labs (all labs ordered are listed, but only abnormal results are displayed) Labs Reviewed  RAPID STREP SCREEN (NOT AT [facility]) - Abnormal; Notable for the following:       Result Value   Streptococcus, Group A Screen (Direct) POSITIVE (*)    All other components within normal limits    EKG  EKG Interpretation  Date/Time:  Friday April 20 2017 21:25:14 EDT Ventricular Rate:  91 PR Interval:  136  QRS Duration: 78 QT Interval:  326 QTC Calculation: 400 R Axis:   45 Text Interpretation:  Normal sinus rhythm Biatrial enlargement Nonspecific ST abnormality Abnormal ECG No old tracing to compare Confirmed by Jacalyn Lefevre [phone]) on 04/20/2017 9:49:29 PM       Radiology No results found.  Procedures Procedures (including critical care time)  Medications Ordered in ED Medications  ibuprofen (ADVIL,MOTRIN) tablet 800 mg (not administered)  dexamethasone (DECADRON) injection 10 mg (not administered)  penicillin g benzathine (BICILLIN LA) 1200000 UNIT/2ML injection 1.2 Million Units (not administered)  acetaminophen (TYLENOL) tablet 650 mg (650 mg  Oral Given 04/20/17 2132)     Initial Impression / Assessment and Plan / ED Course  I have reviewed the triage vital signs and the nursing notes.  Pertinent labs & imaging results that were available during my care of the patient were reviewed by me and considered in my medical decision making (see chart for details).    Pt requests IM penicillin.  She knows to return if worse.  BP is high.  She has chronic, untreated htn.  She is given the number to [facility] to establish pcp.  She knows to return if worse.  Final Clinical Impressions(s) / ED Diagnoses   Final diagnoses:  Strep throat  Essential hypertension    New Prescriptions New Prescriptions   HYDROCODONE-ACETAMINOPHEN (NORCO/VICODIN) 5-325 MG TABLET    Take 1 tablet by mouth every 4 (four) hours as needed.   IBUPROFEN (ADVIL,MOTRIN) 600 MG TABLET    Take 1 tablet (600 mg total) by mouth every 6 (six) hours as needed.     Jacalyn Lefevre, MD 04/20/17 2238

## 2018-01-17 ENCOUNTER — Emergency Department (HOSPITAL_BASED_OUTPATIENT_CLINIC_OR_DEPARTMENT_OTHER)
Admission: EM | Admit: 2018-01-17 | Discharge: 2018-01-17 | Disposition: A | Discharge status: Home / Self Care | Payer: Self-pay | Attending: Emergency Medicine | Admitting: Emergency Medicine

## 2018-01-17 ENCOUNTER — Encounter (HOSPITAL_BASED_OUTPATIENT_CLINIC_OR_DEPARTMENT_OTHER): Payer: Self-pay | Admitting: Emergency Medicine

## 2018-01-17 ENCOUNTER — Other Ambulatory Visit: Payer: Self-pay

## 2018-01-17 DIAGNOSIS — J029 Acute pharyngitis, unspecified: Secondary | ICD-10-CM | POA: Insufficient documentation

## 2018-01-17 DIAGNOSIS — L989 Disorder of the skin and subcutaneous tissue, unspecified: Secondary | ICD-10-CM | POA: Insufficient documentation

## 2018-01-17 DIAGNOSIS — I10 Essential (primary) hypertension: Secondary | ICD-10-CM | POA: Insufficient documentation

## 2018-01-17 DIAGNOSIS — R1031 Right lower quadrant pain: Secondary | ICD-10-CM | POA: Insufficient documentation

## 2018-01-17 LAB — URINALYSIS, ROUTINE W REFLEX MICROSCOPIC
Glucose, UA: NEGATIVE mg/dL
Ketones, ur: 15 mg/dL — AB
LEUKOCYTES UA: NEGATIVE
NITRITE: NEGATIVE
PROTEIN: NEGATIVE mg/dL
Specific Gravity, Urine: 1.025 (ref 1.005–1.030)
pH: 6 (ref 5.0–8.0)

## 2018-01-17 LAB — CBC WITH DIFFERENTIAL/PLATELET
BASOS ABS: 0 10*3/uL (ref 0.0–0.1)
BASOS PCT: 0 %
Eosinophils Absolute: 0.5 10*3/uL (ref 0.0–0.7)
Eosinophils Relative: 3 %
HEMATOCRIT: 37.5 % (ref 36.0–46.0)
Hemoglobin: 12.3 g/dL (ref 12.0–15.0)
LYMPHS ABS: 2.1 10*3/uL (ref 0.7–4.0)
Lymphocytes Relative: 13 %
MCH: 28.7 pg (ref 26.0–34.0)
MCHC: 32.8 g/dL (ref 30.0–36.0)
MCV: 87.4 fL (ref 78.0–100.0)
Monocytes Absolute: 1.3 10*3/uL — ABNORMAL HIGH (ref 0.1–1.0)
Monocytes Relative: 8 %
NEUTROS ABS: 12 10*3/uL — AB (ref 1.7–7.7)
Neutrophils Relative %: 76 %
Platelets: 386 10*3/uL (ref 150–400)
RBC: 4.29 MIL/uL (ref 3.87–5.11)
RDW: 14.1 % (ref 11.5–15.5)
WBC: 15.9 10*3/uL — ABNORMAL HIGH (ref 4.0–10.5)

## 2018-01-17 LAB — COMPREHENSIVE METABOLIC PANEL
ALT: 12 U/L — ABNORMAL LOW (ref 14–54)
AST: 24 U/L (ref 15–41)
Albumin: 3.1 g/dL — ABNORMAL LOW (ref 3.5–5.0)
Alkaline Phosphatase: 77 U/L (ref 38–126)
Anion gap: 11 (ref 5–15)
BILIRUBIN TOTAL: 0.6 mg/dL (ref 0.3–1.2)
BUN: 13 mg/dL (ref 6–20)
CO2: 23 mmol/L (ref 22–32)
Calcium: 8.7 mg/dL — ABNORMAL LOW (ref 8.9–10.3)
Chloride: 104 mmol/L (ref 101–111)
Creatinine, Ser: 0.88 mg/dL (ref 0.44–1.00)
Glucose, Bld: 129 mg/dL — ABNORMAL HIGH (ref 65–99)
POTASSIUM: 4 mmol/L (ref 3.5–5.1)
Sodium: 138 mmol/L (ref 135–145)
TOTAL PROTEIN: 6.6 g/dL (ref 6.5–8.1)

## 2018-01-17 LAB — URINALYSIS, MICROSCOPIC (REFLEX)

## 2018-01-17 MED ORDER — PENICILLIN G BENZATHINE 1200000 UNIT/2ML IM SUSP
1.2000 10*6.[IU] | Freq: Once | INTRAMUSCULAR | Status: AC
  Administered 2018-01-17: 1.2 10*6.[IU] via INTRAMUSCULAR
  Filled 2018-01-17: qty 2

## 2018-01-17 MED ORDER — CLINDAMYCIN HCL 150 MG PO CAPS: 300.0000 mg | ORAL_CAPSULE | Freq: Four times a day (QID) | ORAL | 0 refills | Status: DC

## 2018-01-17 MED ORDER — DEXAMETHASONE SODIUM PHOSPHATE 10 MG/ML IJ SOLN
10.0000 mg | Freq: Once | INTRAMUSCULAR | Status: AC
  Administered 2018-01-17: 10 mg via INTRAVENOUS
  Filled 2018-01-17: qty 1

## 2018-01-17 MED ORDER — KETOROLAC TROMETHAMINE 15 MG/ML IJ SOLN
15.0000 mg | Freq: Once | INTRAMUSCULAR | Status: AC
  Administered 2018-01-17: 15 mg via INTRAVENOUS
  Filled 2018-01-17: qty 1

## 2018-01-17 NOTE — ED Triage Notes (Addendum)
Sore throat x 1 week. Also c/o RLQ pain and states it hurts worse after standing for a long period of time. Pt smoked cocaine 4 days ago.

## 2018-01-17 NOTE — ED Provider Notes (Signed)
[facility] EMERGENCY DEPARTMENT Provider Note   CSN: [id number] Arrival date & time: 01/17/18  1539     History   Chief Complaint Chief Complaint  Patient presents with  . Sore Throat  . Abdominal Pain    HPI Kathleen Drake is a 52 y.o. female.  Patient with history of cocaine use presents today with complaint of sore throat and trouble swallowing due to pain over the past 6 days.  Patient denies fever, chest pain, shortness of breath.  No ear pain or nasal congestion.  No known sick contacts.  More chronically the patient has also had intermittent right lower quadrant pains.  These have been more prevalent over the past week and are worse with standing for long periods of time.  No associated nausea, vomiting, diarrhea.  No dysuria.  No vaginal bleeding or discharge.  No treatments prior to arrival.     Past Medical History:  Diagnosis Date  . Hypertension    Pt denies, is not treated    There are no active problems to display for this patient.   History reviewed. No pertinent surgical history.   OB History   None      Home Medications    Prior to Admission medications   Medication Sig Start Date End Date Taking? Authorizing Provider  HYDROcodone-acetaminophen (NORCO/VICODIN) 5-325 MG tablet Take 1 tablet by mouth every 4 (four) hours as needed. 04/20/17   Jacalyn Lefevre, MD  ibuprofen (ADVIL,MOTRIN) 600 MG tablet Take 1 tablet (600 mg total) by mouth every 6 (six) hours as needed. 04/20/17   Jacalyn Lefevre, MD    Family History No family history on file.  Social History Social History   Tobacco Use  . Smoking status: Never Smoker  . Smokeless tobacco: Never Used  Substance Use Topics  . Alcohol use: Yes    Comment: socially  . Drug use: Yes    Types: Cocaine    Comment: past history     Allergies   Patient has no known allergies.   Review of Systems Review of Systems  Constitutional: Negative for fever.  HENT: Positive for  sore throat and trouble swallowing. Negative for rhinorrhea.   Eyes: Negative for redness.  Respiratory: Negative for cough.   Cardiovascular: Negative for chest pain.  Gastrointestinal: Positive for abdominal pain. Negative for diarrhea, nausea and vomiting.  Genitourinary: Negative for dysuria.  Musculoskeletal: Negative for myalgias.  Skin: Negative for rash.  Neurological: Negative for headaches.     Physical Exam Updated Vital Signs BP (!) 139/106 (BP Location: Left Arm)   Pulse 88   Temp 99.8 F (37.7 C) (Oral)   Resp 19   Ht  (1.575 m)   Wt 74.8 kg (165 lb)   LMP 07/30/2014   SpO2 100%   BMI 30.18 kg/m   Physical Exam  Constitutional: She appears well-developed and well-nourished.  HENT:  Head: Normocephalic and atraumatic.  Right Ear: Tympanic membrane, external ear and ear canal normal.  Left Ear: Tympanic membrane, external ear and ear canal normal.  Nose: Sinus tenderness present. No mucosal edema or rhinorrhea.    Mouth/Throat: Posterior oropharyngeal edema and posterior oropharyngeal erythema present. No oropharyngeal exudate. Tonsils are 3+ on the right. Tonsils are 3+ on the left. No tonsillar exudate.    Eyes: Conjunctivae are normal. Right eye exhibits no discharge. Left eye exhibits no discharge.  Neck: Normal range of motion. Neck supple.  Cardiovascular: Normal rate, regular rhythm and normal heart sounds.  Pulmonary/Chest:  Effort normal and breath sounds normal.  Abdominal: Soft. There is no tenderness. There is no rebound and no guarding.  Abdomen is soft, no focal tenderness.  Lymphadenopathy:       Head (right side): Tonsillar adenopathy present. No submental and no submandibular adenopathy present.       Head (left side): No submental, no submandibular and no tonsillar adenopathy present.    She has no cervical adenopathy.  Neurological: She is alert.  Skin: Skin is warm and dry.  Psychiatric: She has a normal mood and affect.  Nursing  note and vitals reviewed.    ED Treatments / Results  Labs (all labs ordered are listed, but only abnormal results are displayed) Labs Reviewed  URINALYSIS, ROUTINE W REFLEX MICROSCOPIC - Abnormal; Notable for the following components:      Result Value   Hgb urine dipstick TRACE (*)    Bilirubin Urine SMALL (*)    Ketones, ur 15 (*)    All other components within normal limits  URINALYSIS, MICROSCOPIC (REFLEX) - Abnormal; Notable for the following components:   Bacteria, UA RARE (*)    All other components within normal limits  CBC WITH DIFFERENTIAL/PLATELET - Abnormal; Notable for the following components:   WBC 15.9 (*)    Neutro Abs 12.0 (*)    Monocytes Absolute 1.3 (*)    All other components within normal limits  COMPREHENSIVE METABOLIC PANEL - Abnormal; Notable for the following components:   Glucose, Bld 129 (*)    Calcium 8.7 (*)    Albumin 3.1 (*)    ALT 12 (*)    All other components within normal limits    EKG None  Radiology No results found.  Procedures Procedures (including critical care time)  Medications Ordered in ED Medications  penicillin g benzathine (BICILLIN LA) 1200000 UNIT/2ML injection 1.2 Million Units (1.2 Million Units Intramuscular Given 01/17/18 1621)  dexamethasone (DECADRON) injection 10 mg (10 mg Intravenous Given 01/17/18 1731)  ketorolac (TORADOL) 15 MG/ML injection 15 mg (15 mg Intravenous Given 01/17/18 1731)     Initial Impression / Assessment and Plan / ED Course  I have reviewed the triage vital signs and the nursing notes.  Pertinent labs & imaging results that were available during my care of the patient were reviewed by me and considered in my medical decision making (see chart for details).     Patient seen and examined. Work-up initiated. Medications ordered.   Vital signs reviewed and are as follows: BP (!) 139/106 (BP Location: Left Arm)   Pulse 88   Temp 99.8 F (37.7 C) (Oral)   Resp 19   Ht  (1.575 m)    Wt 74.8 kg (165 lb)   LMP 07/30/2014   SpO2 100%   BMI 30.18 kg/m   Borderline fever. Exam is concerning for streptococcal pharyngitis without peritonsillar abscess.  Given episodes of abdominal pain, will evaluate with lab work.  Anticipate patient may have a leukocytosis from her throat infection.  5:35 PM patient updated on results.  Abdomen remains soft and minimally tender.  She has no focal right lower quadrant tenderness.  Patient will be treated with IV Toradol and Decadron.  We will discharged home on 1 week course of clindamycin.  Given patient's exam, cannot entirely rule out developing right-sided peritonsillar abscess.  I do not feel that the patient needs CT imaging at this point.  She has a normal voice, no trismus, and is tolerating secretions.  Discussed that if her  symptoms progress or do not improve in the next 48 hours, she should return to either [facility] or [facility] emergency department for reevaluation.  If at that time she has signs of peritonsillar abscess, she will need ENT follow-up.  No acute indications for imaging based on abdominal exam.  Patient's abdominal pain is chronic and intermittent.  Would not image today.  Encourage close PCP follow-up. The patient was urged to return to the Emergency Department immediately with worsening of current symptoms, worsening abdominal pain, persistent vomiting, blood noted in stools, fever, or any other concerns. The patient verbalized understanding.    Final Clinical Impressions(s) / ED Diagnoses   Final diagnoses:  Pharyngitis, unspecified etiology  Right lower quadrant abdominal pain   Pharyngitis: Treated with abx, will cover with clinda for possible peritonsillar cellulitis/abscess. Pt needs more time for this to declare itself.   Abdominal pain: Intermittent. No focal tenderness. No vaginal or urinary sx. No indication for imaging based on exam today. Encouraged PCP f/u.   Nose lesion: Vaseline to protect,  clinda may help.    ED Discharge Orders        Ordered    clindamycin (CLEOCIN) 150 MG capsule  Every 6 hours     01/17/18 1734       Renne Crigler, PA-C 01/17/18 1743    Loren Racer, MD 01/17/18 [phone]

## 2018-01-17 NOTE — Discharge Instructions (Signed)
Please read and follow all provided instructions.  Your diagnoses today include:  1. Pharyngitis, unspecified etiology     Tests performed today include:  Vital signs. See below for your results today.   Medications prescribed:  You were given a one-time shot of penicillin to treat your strep throat.    Clindamycin - antibiotic  You have been prescribed an antibiotic medicine: take the entire course of medicine even if you are feeling better. Stopping early can cause the antibiotic not to work.  Take any medications prescribed only as directed.   Home care instructions:  Please read the educational materials provided and follow any instructions contained in this packet.  Follow-up instructions: If your symptoms are not improving the next 24 to 48 hours, please go to [facility] or [facility] for recheck.  Return instructions:   Please return to the Emergency Department if you experience worsening symptoms.   Return if you have worsening problems swallowing, your neck becomes swollen, you cannot swallow your saliva or your voice becomes muffled.   Return with high persistent fever, persistent vomiting, or if you have trouble breathing.   Please return if you have any other emergent concerns.  Additional Information:  Your vital signs today were: BP (!) 139/106 (BP Location: Left Arm)    Pulse 88    Temp 99.8 F (37.7 C) (Oral)    Resp 19    Ht  (1.575 m)    Wt 74.8 kg (165 lb)    LMP 07/30/2014    SpO2 100%    BMI 30.18 kg/m  If your blood pressure (BP) was elevated above 135/85 this visit, please have this repeated by your doctor within one month. --------------

## 2018-02-25 IMAGING — CT CT ABD-PELV W/ CM
2 of 5 series · 16 of 46 positions shown, 18 images · IV contrast (APPLIED)
Comparison: None.

CLINICAL DATA: Right lower quadrant pain with nausea.

EXAM:
CT ABDOMEN AND PELVIS WITH CONTRAST
TECHNIQUE: Multidetector CT imaging of the abdomen and pelvis was performed
using the standard protocol following bolus administration of
intravenous contrast.
CONTRAST:  100mL UF5YHR-411 IOPAMIDOL (UF5YHR-411) INJECTION 61%

[Series 3: axial st · axial · 0.98mm/px · z∈[+858,+1278]mm · 13 of 96 slices shown, 15 images]
[im 6/96  soft-tissue]
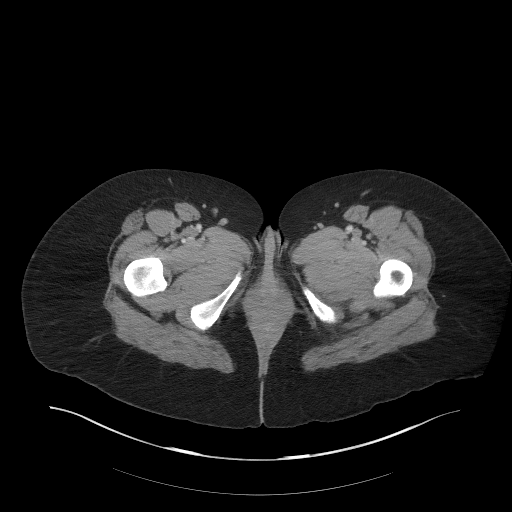
[im 6/96  bone]
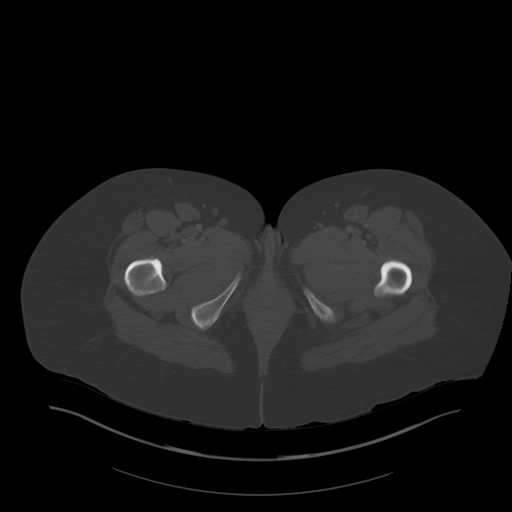
[im 12/96  soft-tissue]
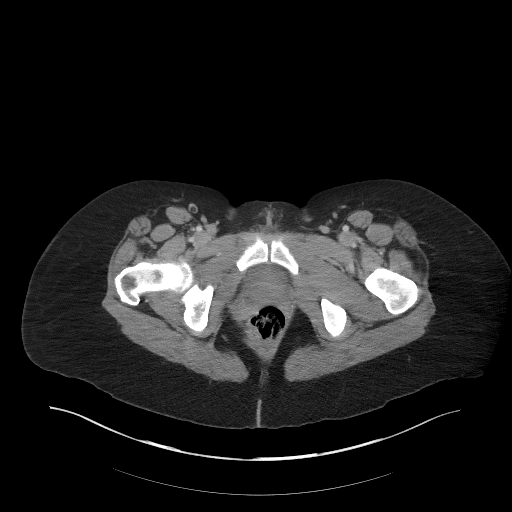
[im 23/96  soft-tissue]
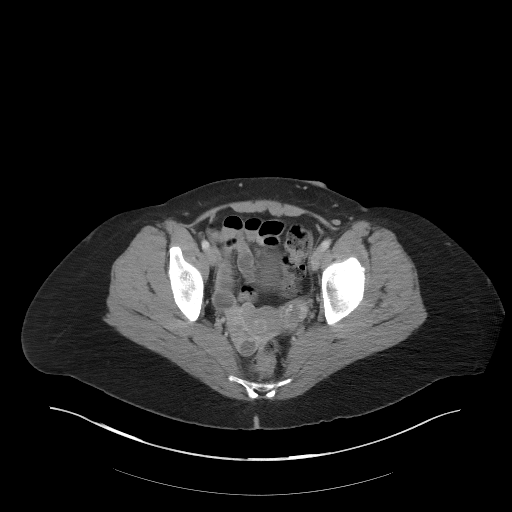
[im 28/96  soft-tissue]
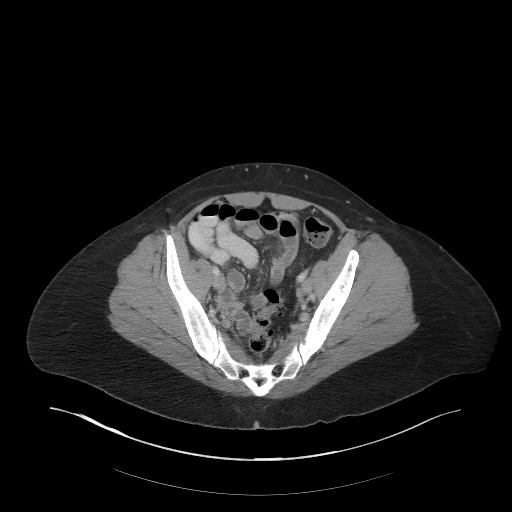
[im 34/96  soft-tissue]
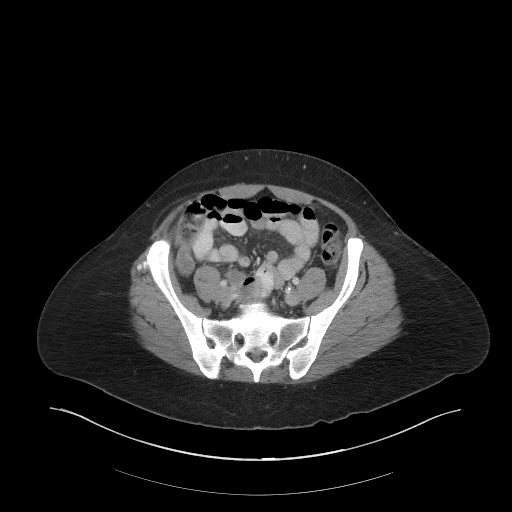
[im 40/96  soft-tissue]
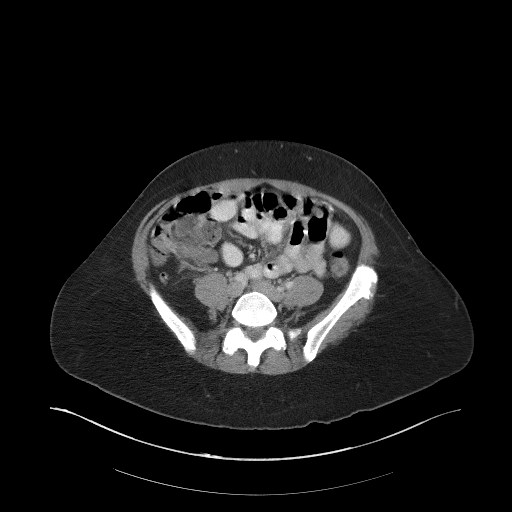
[im 51/96  soft-tissue]
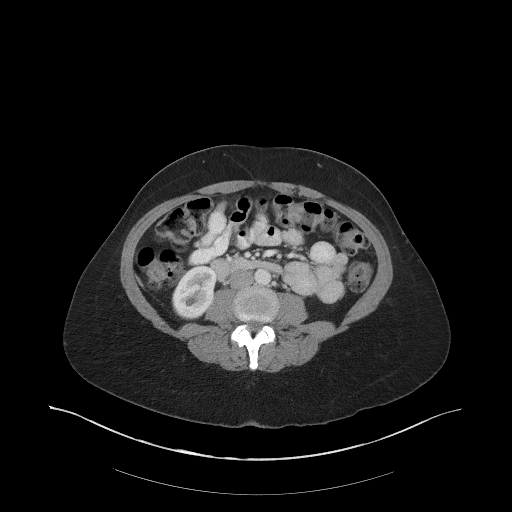
[im 56/96  soft-tissue]
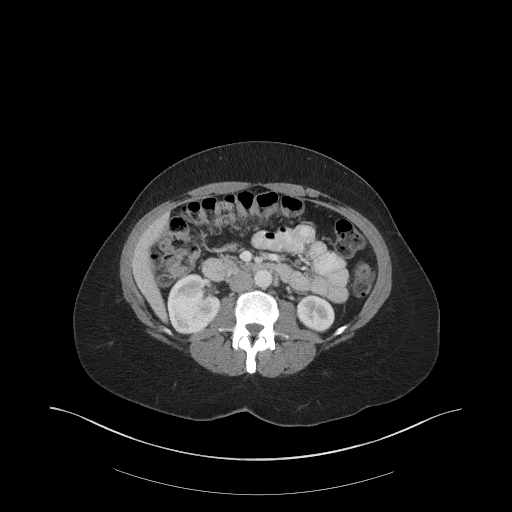
[im 62/96  soft-tissue]
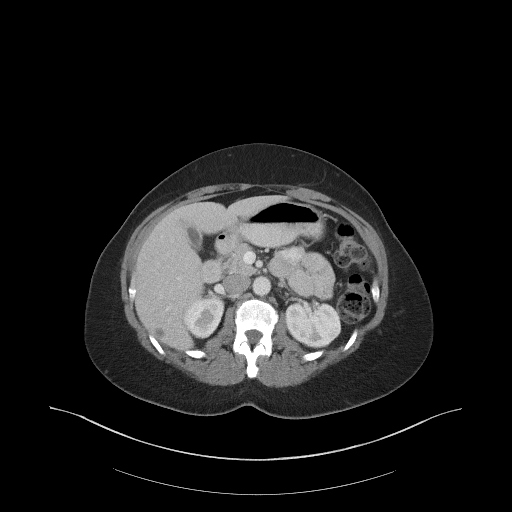
[im 62/96  bone]
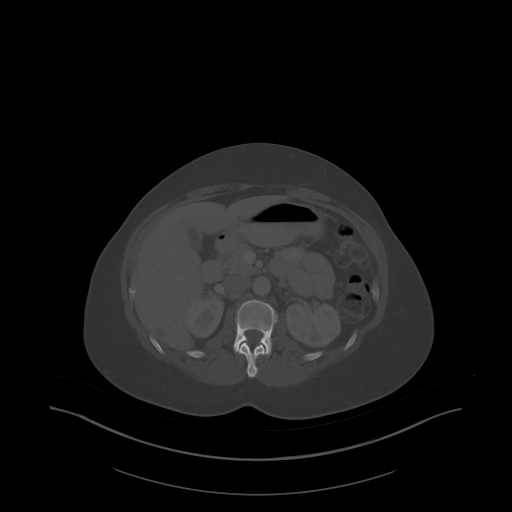
[im 68/96  soft-tissue]
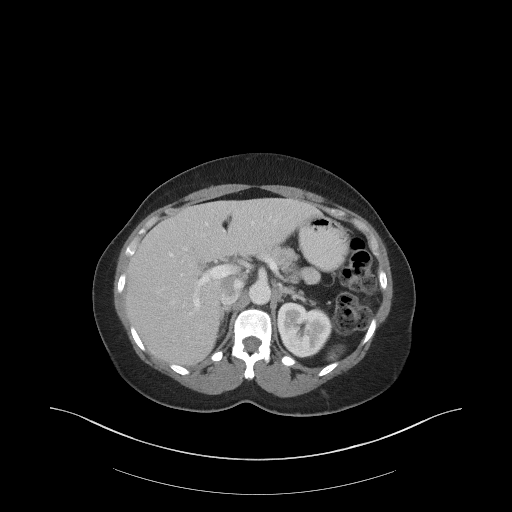
[im 73/96  soft-tissue]
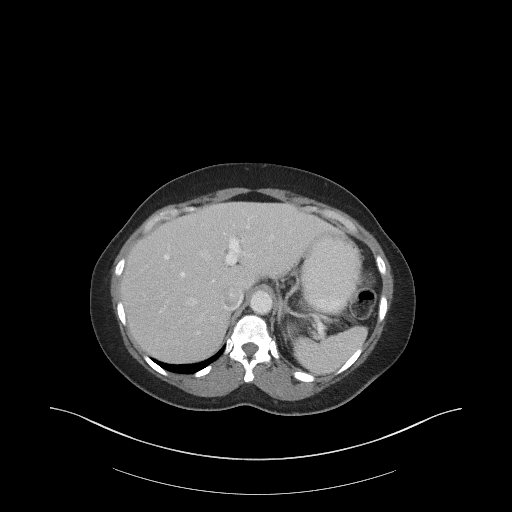
[im 84/96  soft-tissue]
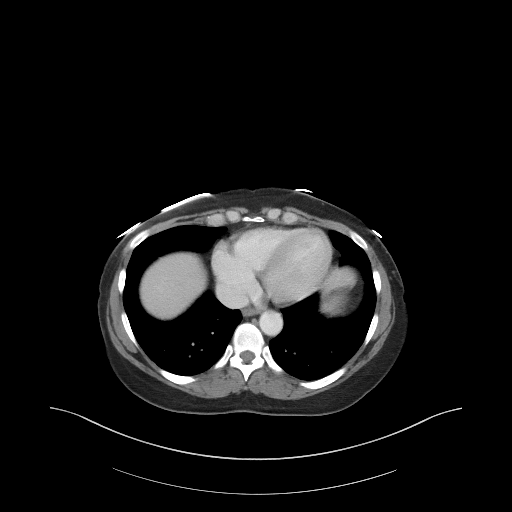
[im 90/96  soft-tissue]
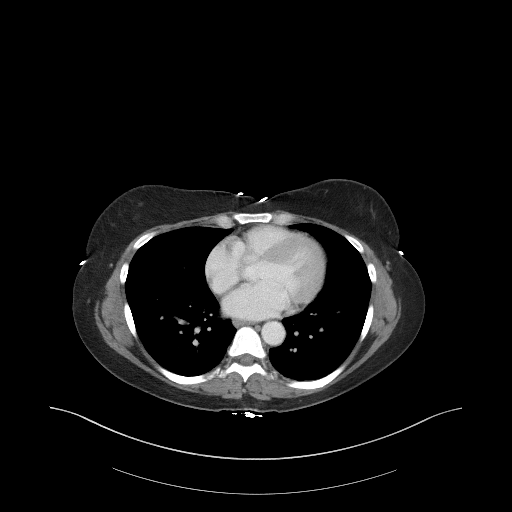

[Series 6: coronal st · coronal · 0.87mm/px · 3 of 92 slices shown]
[im 31/92  soft-tissue]
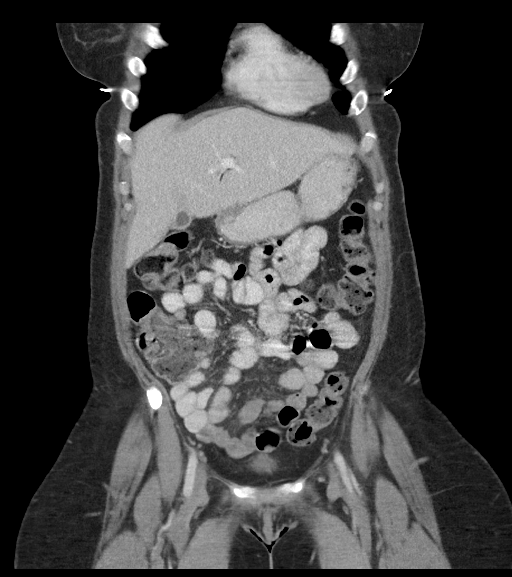
[im 41/92  soft-tissue]
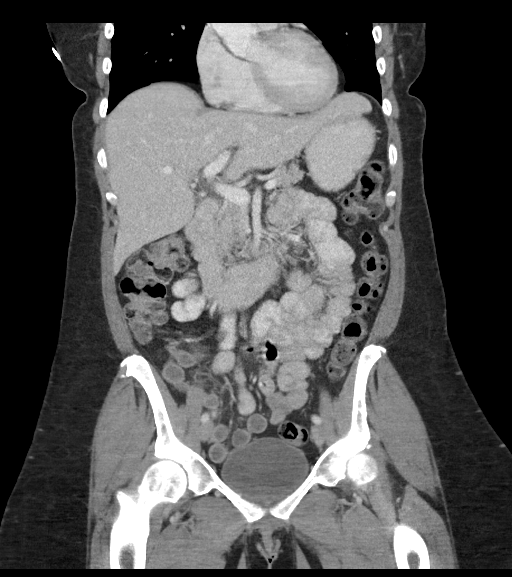
[im 51/92  soft-tissue]
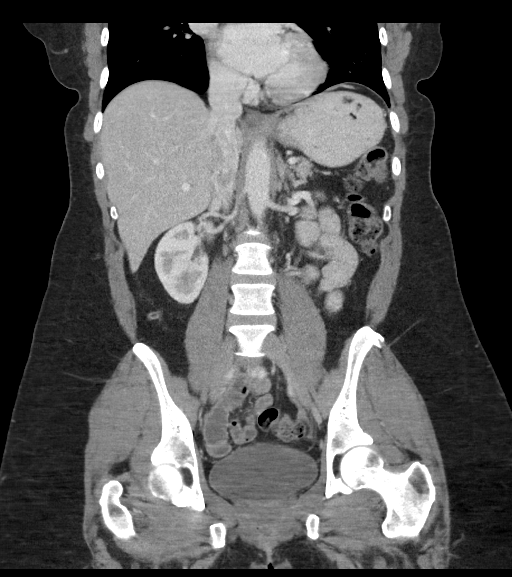

[16 of 46 positions shown; findings below may reference images not displayed]

FINDINGS: There is a 4 mm nodule in the right lung on series 5, image 4. A
smaller nodule is suspected on series 5, image 5 on the left. The
lung bases are otherwise within normal limits.

No free air.  There is a small amount of free fluid in the pelvis.

The gallbladder is decompressed limiting evaluation. A
low-attenuation lesion in the right hepatic lobe is almost certainly
a cyst but too small to characterize. The liver and portal vein are
otherwise normal. The spleen, adrenal glands, and pancreas are
normal. The kidneys demonstrate no abnormalities. The abdominal
aorta is non aneurysmal with no atherosclerotic change. No
adenopathy. The stomach and small bowel are normal. The colon and
appendix are normal.

Probable fibroids in the uterus. A high attenuation mass projecting
posteriorly off the uterus on series 3, image 76 is likely a
fibroid. Several small follicles in the left ovary. There is some
fluid in the pelvis. No definitive right ovarian mass.

Visualized bones are unremarkable.
IMPRESSION: 1. There is a small amount of fluid in the pelvis, likely
physiologic. It is possible the patient's pain is adnexal in origin.
If there is concern, an ultrasound could better evaluate as
clinically warranted.
2. Probable fibroid uterus.
3. The appendix is normal. No other cause for right lower quadrant
pain is identified.
4. 4 mm nodule in the right lung. See below for follow-up
recommendations.
No follow-up needed if patient is low-risk. Non-contrast chest CT
can be considered in 12 months if patient is high-risk. This
recommendation follows the consensus statement: Guidelines for
Management of Incidental Pulmonary Nodules Detected on CT
Images:From the [HOSPITAL] 1426; published online before
print (10.1148/radiol.4324272754).

## 2018-03-05 ENCOUNTER — Emergency Department (HOSPITAL_BASED_OUTPATIENT_CLINIC_OR_DEPARTMENT_OTHER)
Admission: EM | Admit: 2018-03-05 | Discharge: 2018-03-05 | Disposition: A | Discharge status: Home / Self Care | Payer: Self-pay | Attending: Emergency Medicine | Admitting: Emergency Medicine

## 2018-03-05 ENCOUNTER — Encounter (HOSPITAL_BASED_OUTPATIENT_CLINIC_OR_DEPARTMENT_OTHER): Payer: Self-pay

## 2018-03-05 ENCOUNTER — Other Ambulatory Visit: Payer: Self-pay

## 2018-03-05 DIAGNOSIS — I10 Essential (primary) hypertension: Secondary | ICD-10-CM

## 2018-03-05 DIAGNOSIS — R220 Localized swelling, mass and lump, head: Secondary | ICD-10-CM

## 2018-03-05 DIAGNOSIS — K047 Periapical abscess without sinus: Secondary | ICD-10-CM

## 2018-03-05 MED ORDER — NAPROXEN 500 MG PO TABS: 500.0000 mg | ORAL_TABLET | Freq: Two times a day (BID) | ORAL | 0 refills | Status: DC

## 2018-03-05 MED ORDER — CLINDAMYCIN HCL 150 MG PO CAPS
450.0000 mg | ORAL_CAPSULE | Freq: Once | ORAL | Status: AC
  Administered 2018-03-05: 450 mg via ORAL
  Filled 2018-03-05: qty 3

## 2018-03-05 MED ORDER — CLINDAMYCIN HCL 150 MG PO CAPS: 450.0000 mg | ORAL_CAPSULE | Freq: Three times a day (TID) | ORAL | 0 refills | Status: AC

## 2018-03-05 MED ORDER — HYDROCODONE-ACETAMINOPHEN 5-325 MG PO TABS
2.0000 | ORAL_TABLET | Freq: Once | ORAL | Status: AC
  Administered 2018-03-05: 2 via ORAL
  Filled 2018-03-05: qty 2

## 2018-03-05 NOTE — ED Triage Notes (Signed)
C/o swelling to right side of face-denies injury-NAD-steady gait

## 2018-03-05 NOTE — ED Notes (Signed)
Pt sts she used to take BP meds approx 5058yrs ago but stopped b/c her BP was "better".

## 2018-03-05 NOTE — ED Notes (Signed)
Pt verbalizes understanding of d/c instructions and denies any further needs at this time. 

## 2018-03-05 NOTE — Discharge Instructions (Signed)
As we discussed, your blood pressure was elevated here in the emergency department today.  This needs to be reevaluated by primary care doctor to see if you need to be started on any medication.  Return the emergency department for any vision changes, chest pain, difficulty breathing, numbness/weakness of your arms or legs or any other worsening or concerning symptoms.  Take antibiotics as directed. Please take all of your antibiotics until finished.  Take Naprosyn as directed for pain.  The exam and treatment you received today has been provided on an emergency basis only. This is not a substitute for complete medical or dental care. If your problem worsens or new symptoms (problems) appear, and you are unable to arrange prompt follow-up care with your dentist, call or return to this location. If you do not have a dentist, please follow-up with one on the list provided  CALL YOUR DENTIST OR RETURN IMMEDIATELY IF you develop a fever, rash, difficulty breathing or swallowing, neck or facial swelling, or other potentially serious concerns.   Please follow-up with one of the dental clinics provided to you below or in your paperwork. Call and tell them you were seen in the Emergency Dept and arrange for an appointment. You may have to call multiple places in order to find a place to be seen.  Dental Assistance If the dentist on-call cannot see you, please use the resources below:   Patients with Medicaid: [facility] [id number], [phone] [address], [phone]  If unable to pay, or uninsured, contact [facility] [phone]) or [facility] [phone] in [place], [phone] in [facility] [place]) to become qualified for the adult dental clinic  Other Low-Cost Community Dental Services: [facility]- [address], [place], [place], [address]    [phone], Ext. [phone]    2nd and 4th Thursday of the month at 6:30am    10 clients each day by  appointment, can sometimes see walk-in     patients if someone does not show for an appointment [facility]- [address], [place], [place], [id number]    [phone] [facility]- [address], [place], [place], [address]    [phone]  [facility]- [phone] [facility]- [phone] [facility]- [phone]

## 2018-03-05 NOTE — ED Provider Notes (Signed)
[facility] EMERGENCY DEPARTMENT Provider Note   CSN: [id number] Arrival date & time: 03/05/18  2011     History   Chief Complaint Chief Complaint  Patient presents with  . Facial Swelling    HPI Kathleen Drake is a 52 y.o. female past medical history hypertension who presents for evaluation of right-sided facial swelling that began 2 days ago.  Patient reports that she has associated pain that radiates up the right side of her face.  Patient reports that she has been taking Advil and Tylenol with minimal improvement in symptoms.  Patient reports that she feels some dental pain in the right upper jaw but states that she does not have a tooth there so she does not think it is her teeth causing problems.  Patient reports that it hurts more to eat but she has been able to tolerate p.o. without any difficulty.  She has had no difficulty tolerating her secretions.  Patient reports that sometimes the pain is so bad that it takes her breath away but otherwise denies any difficulty breathing.  Patient reports that she has not noticed any overlying erythema but has noticed some warmth.  Patient denies any fevers, vision changes, difficulty breathing, vomiting, chest pain, numbness/weakness of her extremities.  Patient reports a history of hypertension.  She reports that she was previously on medication but she had been taking.  Patient reports that she stopped medication herself when she had been taking her blood pressure and states that it was normal.  She has not been on any medication for the last 2 years.  Patient does not follow with the dentist.  The history is provided by the patient.    Past Medical History:  Diagnosis Date  . Hypertension    Pt denies, is not treated    There are no active problems to display for this patient.   History reviewed. No pertinent surgical history.   OB History   None      Home Medications    Prior to Admission medications     Medication Sig Start Date End Date Taking? Authorizing Provider  clindamycin (CLEOCIN) 150 MG capsule Take 3 capsules (450 mg total) by mouth 3 (three) times daily for 7 days. 03/05/18 03/12/18  Maxwell CaulLayden, [place] Trawick A, PA-C  HYDROcodone-acetaminophen (NORCO/VICODIN) 5-325 MG tablet Take 1 tablet by mouth every 4 (four) hours as needed. 04/20/17   Jacalyn LefevreHaviland, Julie, MD  ibuprofen (ADVIL,MOTRIN) 600 MG tablet Take 1 tablet (600 mg total) by mouth every 6 (six) hours as needed. 04/20/17   Jacalyn LefevreHaviland, Julie, MD  naproxen (NAPROSYN) 500 MG tablet Take 1 tablet (500 mg total) by mouth 2 (two) times daily. 03/05/18   Maxwell CaulLayden, Alayia Meggison A, PA-C    Family History No family history on file.  Social History Social History   Tobacco Use  . Smoking status: Never Smoker  . Smokeless tobacco: Never Used  Substance Use Topics  . Alcohol use: Yes    Comment: occ  . Drug use: Not Currently     Allergies   Patient has no known allergies.   Review of Systems Review of Systems  Constitutional: Negative for fever.  HENT: Positive for dental problem and facial swelling. Negative for drooling, sore throat and trouble swallowing.   Eyes: Negative for visual disturbance.  Respiratory: Negative for shortness of breath.   Cardiovascular: Negative for chest pain.  Gastrointestinal: Negative for vomiting.     Physical Exam Updated Vital Signs BP (!) 158/104 (BP Location: Right Arm)  Pulse 67   Temp 99 F (37.2 C) (Oral)   Resp 18   Ht 5\' 2"  (1.575 m)   Wt 78.5 kg (173 lb)   LMP 07/30/2014   SpO2 100%   BMI 31.64 kg/m   Physical Exam  Constitutional: She appears well-developed and well-nourished.  HENT:  Head: Normocephalic and atraumatic.  Mouth/Throat: Uvula is midline, oropharynx is clear and moist and mucous membranes are normal. No trismus in the jaw. Abnormal dentition.  Airways patent, phonation is intact.  Right-sided facial swelling that extends over the upper cheek area.  Uvula is midline.  No  trismus.  Diffuse dental caries noted throughout.  There is some gingival erythema and edema noted to the right upper gum.  No obvious identifiable abscess.  No oral angioedema.  Eyes: Pupils are equal, round, and reactive to light. Conjunctivae and EOM are normal. Right eye exhibits no discharge. Left eye exhibits no discharge. No scleral icterus.  EOMs intact without difficulty.  No periorbital tenderness, edema, erythema.  Pulmonary/Chest: Effort normal.  Neurological: She is alert.  Cranial nerves III-XII intact Follows commands, Moves all extremities  5/5 strength to BUE and BLE  Sensation intact throughout all major nerve distributions Normal finger to nose. No gait abnormalities  No slurred speech. No facial droop.   Skin: Skin is warm and dry.  Psychiatric: She has a normal mood and affect. Her speech is normal and behavior is normal.  Nursing note and vitals reviewed.    ED Treatments / Results  Labs (all labs ordered are listed, but only abnormal results are displayed) Labs Reviewed - No data to display  EKG None  Radiology No results found.  Procedures Procedures (including critical care time)  Medications Ordered in ED Medications  HYDROcodone-acetaminophen (NORCO/VICODIN) 5-325 MG per tablet 2 tablet (2 tablets Oral Given 03/05/18 2115)  clindamycin (CLEOCIN) capsule 450 mg (450 mg Oral Given 03/05/18 2147)     Initial Impression / Assessment and Plan / ED Course  I have reviewed the triage vital signs and the nursing notes.  Pertinent labs & imaging results that were available during my care of the patient were reviewed by me and considered in my medical decision making (see chart for details).     52 year old female who presents for evaluation of right-sided facial swelling x2 days.  No fevers, difficulty tolerating p.o., vomiting. Patient is afebrile, non-toxic appearing, sitting comfortably on examination table.  Vital signs reviewed.  On initial  evaluation, patient is hypertensive.  She does report a history of high blood pressure and states she used to be on medication but states she stopped it herself when she kept checking her blood pressure noticed that it was normal.  Has not followed up with PCP since then.  No chest pain, difficulty breathing, numbness/weakness of her extremities today.  Do not suspect hypertensive emergency based on history/physical exam.  On exam, patient has right-sided facial swelling.  It does not extend up into the periorbital region.  Dentition exam shows poor dentition throughout.  She does have some gingival erythema and edema noted to the right upper gum.  No identifiable dental abscess.  No trismus.  Uvula is midline.  Suspect that this is related to dental abscess.  History/physical exam is not concerning for peritonsillar abscess, Ludwig angina, preseptal or orbital cellulitis.  Will give pain medication here in the department to help with blood pressure.  I suspect the blood pressure is elevated from pain but also from noncompliance with medication.  Do not suspect hypertensive emergency at this time.  Blood pressure improved after analgesics.  Still slightly elevated.  Do not suspect hypertensive emergency at this time.  Given concern for possible dental abscess, will plan to start patient on clindamycin.  Patient tolerating p.o. in the department without any difficulty at this time.  Airway is patent, phonation is normal.  Patient stable for discharge at this time.  We will plan to start patient on antibiotics Naprosyn for pain relief.  Encourage follow-up with referred dental clinics for further evaluation of dental abnormality's.  Additionally, instructed patient that she needs to follow-up and establish primary care for evaluation of her high blood pressure.  Patient provided with [facility] for further primary care evaluation.  Patient had ample opportunity for questions and discussion. All patient's  questions were answered with full understanding. Strict return precautions discussed. Patient expresses understanding and agreement to plan.   Final Clinical Impressions(s) / ED Diagnoses   Final diagnoses:  Facial swelling  Dental abscess  Hypertension, unspecified type    ED Discharge Orders        Ordered    naproxen (NAPROSYN) 500 MG tablet  2 times daily     03/05/18 2203    clindamycin (CLEOCIN) 150 MG capsule  3 times daily     03/05/18 2203       Rosana Hoes 03/05/18 2208    Melene Plan, DO 03/05/18 2329

## 2020-07-23 ENCOUNTER — Other Ambulatory Visit: Payer: Self-pay

## 2020-07-23 ENCOUNTER — Encounter (HOSPITAL_BASED_OUTPATIENT_CLINIC_OR_DEPARTMENT_OTHER): Payer: Self-pay | Admitting: Emergency Medicine

## 2020-07-23 ENCOUNTER — Emergency Department (HOSPITAL_BASED_OUTPATIENT_CLINIC_OR_DEPARTMENT_OTHER)
Admission: EM | Admit: 2020-07-23 | Discharge: 2020-07-23 | Disposition: A | Discharge status: Home / Self Care | Payer: Self-pay | Attending: Emergency Medicine | Admitting: Emergency Medicine

## 2020-07-23 DIAGNOSIS — R0789 Other chest pain: Secondary | ICD-10-CM | POA: Insufficient documentation

## 2020-07-23 DIAGNOSIS — I10 Essential (primary) hypertension: Secondary | ICD-10-CM | POA: Insufficient documentation

## 2020-07-23 DIAGNOSIS — Z79899 Other long term (current) drug therapy: Secondary | ICD-10-CM | POA: Insufficient documentation

## 2020-07-23 LAB — CBC
HCT: 40.6 % (ref 36.0–46.0)
Hemoglobin: 12.6 g/dL (ref 12.0–15.0)
MCH: 28.4 pg (ref 26.0–34.0)
MCHC: 31 g/dL (ref 30.0–36.0)
MCV: 91.6 fL (ref 80.0–100.0)
Platelets: 322 10*3/uL (ref 150–400)
RBC: 4.43 MIL/uL (ref 3.87–5.11)
RDW: 15.6 % — ABNORMAL HIGH (ref 11.5–15.5)
WBC: 6.2 10*3/uL (ref 4.0–10.5)
nRBC: 0 % (ref 0.0–0.2)

## 2020-07-23 LAB — TROPONIN I (HIGH SENSITIVITY): Troponin I (High Sensitivity): 3 ng/L (ref <18)

## 2020-07-23 LAB — BASIC METABOLIC PANEL
Anion gap: 8 (ref 5–15)
BUN: 17 mg/dL (ref 6–20)
CO2: 27 mmol/L (ref 22–32)
Calcium: 9.1 mg/dL (ref 8.9–10.3)
Chloride: 103 mmol/L (ref 98–111)
Creatinine, Ser: 0.76 mg/dL (ref 0.44–1.00)
GFR, Estimated: >60 mL/min (ref >60)
Glucose, Bld: 94 mg/dL (ref 70–99)
Potassium: 3.9 mmol/L (ref 3.5–5.1)
Sodium: 138 mmol/L (ref 135–145)

## 2020-07-23 MED ORDER — AMLODIPINE BESYLATE 5 MG PO TABS: 5.0000 mg | ORAL_TABLET | Freq: Every day | ORAL | 0 refills | Status: DC

## 2020-07-23 NOTE — ED Provider Notes (Signed)
[facility] EMERGENCY DEPARTMENT Provider Note   CSN: [id number] Arrival date & time: 07/23/20  1152     History Chief Complaint  Patient presents with  . Hypertension    Kathleen Drake is a 54 y.o. female.  HPI Patient with hypertension.  States she was at a screening for clinical studies and was found to have elevated blood pressure.  States it was 190/100 something.  States that rechecked and was still awaited.  States she was told to come to the ER.  States she has had high blood pressure in the past and was started on medicine but then stopped it because her blood pressure seem normal and she had not had high blood pressure before.  Has not consistently see Dr.  Andrey Cota earlier today she did have some sharp left lower chest pain.  States that resolved now.  No numbness weakness.  No headache.  No confusion.    Past Medical History:  Diagnosis Date  . Hypertension    Pt denies, is not treated    There are no problems to display for this patient.   History reviewed. No pertinent surgical history.   OB History   No obstetric history on file.     No family history on file.  Social History   Tobacco Use  . Smoking status: Never Smoker  . Smokeless tobacco: Never Used  Substance Use Topics  . Alcohol use: Yes    Comment: occ  . Drug use: Not Currently    Home Medications Prior to Admission medications   Medication Sig Start Date End Date Taking? Authorizing Provider  amLODipine (NORVASC) 5 MG tablet Take 1 tablet (5 mg total) by mouth daily. 07/23/20   Benjiman Core, MD  HYDROcodone-acetaminophen (NORCO/VICODIN) 5-325 MG tablet Take 1 tablet by mouth every 4 (four) hours as needed. 04/20/17   Jacalyn Lefevre, MD  ibuprofen (ADVIL,MOTRIN) 600 MG tablet Take 1 tablet (600 mg total) by mouth every 6 (six) hours as needed. 04/20/17   Jacalyn Lefevre, MD  naproxen (NAPROSYN) 500 MG tablet Take 1 tablet (500 mg total) by mouth 2 (two) times daily.  03/05/18   Maxwell Caul, PA-C    Allergies    Patient has no known allergies.  Review of Systems   Review of Systems  Constitutional: Negative for appetite change and fever.  HENT: Negative for congestion.   Respiratory: Negative for shortness of breath.   Cardiovascular: Positive for chest pain.  Gastrointestinal: Negative for abdominal pain.  Genitourinary: Negative for flank pain.  Musculoskeletal: Negative for back pain.  Skin: Negative for rash.  Neurological: Negative for weakness.  Psychiatric/Behavioral: Negative for confusion.    Physical Exam Updated Vital Signs BP (!) 167/99 (BP Location: Right Arm)   Pulse 66   Temp 98.2 F (36.8 C) (Oral)   Resp 18   Ht 5\' 2"  (1.575 m)   Wt 79.8 kg   LMP 07/30/2014   SpO2 99%   BMI 32.19 kg/m   Physical Exam Vitals and nursing note reviewed.  HENT:     Head: Atraumatic.  Eyes:     Extraocular Movements: Extraocular movements intact.     Pupils: Pupils are equal, round, and reactive to light.  Cardiovascular:     Rate and Rhythm: Regular rhythm.  Chest:     Chest wall: No tenderness.  Abdominal:     Tenderness: There is no abdominal tenderness.  Musculoskeletal:     Cervical back: Neck supple.     Right  lower leg: No edema.     Left lower leg: No edema.  Skin:    General: Skin is warm.     Capillary Refill: Capillary refill takes less than 2 seconds.  Neurological:     Mental Status: She is alert and oriented to person, place, and time.     ED Results / Procedures / Treatments   Labs (all labs ordered are listed, but only abnormal results are displayed) Labs Reviewed  CBC - Abnormal; Notable for the following components:      Result Value   RDW 15.6 (*)    All other components within normal limits  BASIC METABOLIC PANEL  TROPONIN I (HIGH SENSITIVITY)    EKG EKG Interpretation  Date/Time:  Friday July 23 2020 13:35:38 EST Ventricular Rate:  66 PR Interval:    QRS Duration: 100 QT  Interval:  428 QTC Calculation: 449 R Axis:   57 Text Interpretation: Sinus rhythm Consider left atrial enlargement Abnormal R-wave progression, early transition Confirmed by Benjiman Core [phone]) on 07/23/2020 1:39:30 PM   Radiology No results found.  Procedures Procedures (including critical care time)  Medications Ordered in ED Medications - No data to display  ED Course  I have reviewed the triage vital signs and the nursing notes.  Pertinent labs & imaging results that were available during my care of the patient were reviewed by me and considered in my medical decision making (see chart for details).    MDM Rules/Calculators/A&P                          Patient sent in for hypertension.  It was just a screening test.  Patient may have had hypertension previously it sounds like she been on some medicine but stopped taking it.  Did have some sharp chest pain earlier today.  Doubt this is an anginal equivalent.  Troponin reassuring.  Good renal function.  However recheck blood pressure is still elevated.  I think this is likely a chronic hypertension as opposed to a white coat syndrome or other equivalent.  Reviewed some previous vitals and appear to be elevated at time.  As such we will start amlodipine.  Discharge home with PCP follow-up.  Patient states she is try to get back in with the clinic she was seen previously. Final Clinical Impression(s) / ED Diagnoses Final diagnoses:  Hypertension, unspecified type    Rx / DC Orders ED Discharge Orders         Ordered    amLODipine (NORVASC) 5 MG tablet  Daily        07/23/20 1355           Benjiman Core, MD 07/23/20 1531

## 2020-07-23 NOTE — ED Triage Notes (Signed)
Patient was being screened for clinical trial in [place] and BP was high today.Denies medical problems.

## 2020-07-23 NOTE — Discharge Instructions (Signed)
Follow-up with the primary care doctor as planned.

## 2021-04-09 ENCOUNTER — Encounter (HOSPITAL_BASED_OUTPATIENT_CLINIC_OR_DEPARTMENT_OTHER): Payer: Self-pay

## 2021-04-09 ENCOUNTER — Emergency Department (HOSPITAL_BASED_OUTPATIENT_CLINIC_OR_DEPARTMENT_OTHER): Payer: Self-pay

## 2021-04-09 ENCOUNTER — Other Ambulatory Visit: Payer: Self-pay

## 2021-04-09 ENCOUNTER — Emergency Department (HOSPITAL_BASED_OUTPATIENT_CLINIC_OR_DEPARTMENT_OTHER)
Admission: EM | Admit: 2021-04-09 | Discharge: 2021-04-09 | Disposition: A | Discharge status: Home / Self Care | Payer: Self-pay | Attending: Emergency Medicine | Admitting: Emergency Medicine

## 2021-04-09 DIAGNOSIS — I10 Essential (primary) hypertension: Secondary | ICD-10-CM | POA: Insufficient documentation

## 2021-04-09 DIAGNOSIS — R079 Chest pain, unspecified: Secondary | ICD-10-CM

## 2021-04-09 DIAGNOSIS — M25521 Pain in right elbow: Secondary | ICD-10-CM | POA: Insufficient documentation

## 2021-04-09 DIAGNOSIS — I712 Thoracic aortic aneurysm, without rupture, unspecified: Secondary | ICD-10-CM

## 2021-04-09 DIAGNOSIS — Z79899 Other long term (current) drug therapy: Secondary | ICD-10-CM | POA: Insufficient documentation

## 2021-04-09 LAB — CBC WITH DIFFERENTIAL/PLATELET
Abs Immature Granulocytes: 0.02 10*3/uL (ref 0.00–0.07)
Basophils Absolute: 0 10*3/uL (ref 0.0–0.1)
Basophils Relative: 1 %
Eosinophils Absolute: 0.3 10*3/uL (ref 0.0–0.5)
Eosinophils Relative: 4 %
HCT: 38.4 % (ref 36.0–46.0)
Hemoglobin: 12.2 g/dL (ref 12.0–15.0)
Immature Granulocytes: 0 %
Lymphocytes Relative: 32 %
Lymphs Abs: 2 10*3/uL (ref 0.7–4.0)
MCH: 28.7 pg (ref 26.0–34.0)
MCHC: 31.8 g/dL (ref 30.0–36.0)
MCV: 90.4 fL (ref 80.0–100.0)
Monocytes Absolute: 0.4 10*3/uL (ref 0.1–1.0)
Monocytes Relative: 7 %
Neutro Abs: 3.6 10*3/uL (ref 1.7–7.7)
Neutrophils Relative %: 56 %
Platelets: 432 10*3/uL — ABNORMAL HIGH (ref 150–400)
RBC: 4.25 MIL/uL (ref 3.87–5.11)
RDW: 15.1 % (ref 11.5–15.5)
WBC: 6.3 10*3/uL (ref 4.0–10.5)
nRBC: 0 % (ref 0.0–0.2)

## 2021-04-09 LAB — COMPREHENSIVE METABOLIC PANEL
ALT: 12 U/L (ref 0–44)
AST: 15 U/L (ref 15–41)
Albumin: 3.4 g/dL — ABNORMAL LOW (ref 3.5–5.0)
Alkaline Phosphatase: 67 U/L (ref 38–126)
Anion gap: 8 (ref 5–15)
BUN: 12 mg/dL (ref 6–20)
CO2: 26 mmol/L (ref 22–32)
Calcium: 8.8 mg/dL — ABNORMAL LOW (ref 8.9–10.3)
Chloride: 107 mmol/L (ref 98–111)
Creatinine, Ser: 0.9 mg/dL (ref 0.44–1.00)
GFR, Estimated: >60 mL/min (ref >60)
Glucose, Bld: 98 mg/dL (ref 70–99)
Potassium: 3.8 mmol/L (ref 3.5–5.1)
Sodium: 141 mmol/L (ref 135–145)
Total Bilirubin: 0.1 mg/dL — ABNORMAL LOW (ref 0.3–1.2)
Total Protein: 6.5 g/dL (ref 6.5–8.1)

## 2021-04-09 LAB — D-DIMER, QUANTITATIVE: D-Dimer, Quant: 0.27 ug/mL-FEU (ref 0.00–0.50)

## 2021-04-09 LAB — TROPONIN I (HIGH SENSITIVITY): Troponin I (High Sensitivity): 4 ng/L (ref <18)

## 2021-04-09 MED ORDER — IOHEXOL 350 MG/ML SOLN
100.0000 mL | Freq: Once | INTRAVENOUS | Status: AC | PRN
  Administered 2021-04-09: 100 mL via INTRAVENOUS

## 2021-04-09 NOTE — ED Notes (Signed)
Pt ambulatory to RR, steady gate noted.

## 2021-04-09 NOTE — ED Notes (Signed)
Patient transported to X-ray 

## 2021-04-09 NOTE — ED Triage Notes (Signed)
"  Chest pain on right side of chest x 3 days, radiates to right arm and right side of back, some shortness of breath" per pt  Denies cough, n/v or diarrhea

## 2021-04-09 NOTE — ED Notes (Signed)
Pt resting comfertably, warm blanket provided. Updated on POC.

## 2021-04-09 NOTE — Discharge Instructions (Addendum)
Follow-up with your doctor in the next few days for recheck.  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  This can be arranged by your primary care doctor.  Return to emergency room if you have any worsening symptoms.  Make sure that you are taking your blood pressure medicine.

## 2021-04-09 NOTE — ED Provider Notes (Signed)
[facility] EMERGENCY DEPARTMENT Provider Note   CSN: [id number] Arrival date & time: 04/09/21  1843     History Chief Complaint  Patient presents with   Chest Pain    Kathleen Drake is a 55 y.o. female.  Patient is a 55 year old female with a history of hypertension who presents with chest pain.  She states for the last 3 days she has had some constant pain in her right chest.  It radiates to her right upper back.  She has had some associated shortness of breath.  She says it hurts to take a deep breath.  She denies any leg pain or swelling.  She is a non-smoker.  No recent immobilization or traveling.  She denies any cough or cold symptoms.  No fevers.  The pain is not worse with exertion.  It somewhat worse with movement.  She also started having some pain in her right elbow yesterday.  No swelling or recent injuries to the elbow.  No numbness or weakness in the hand.  No history of similar symptoms in the past.  No known history of prior heart problems.  She did not take her blood pressure medicine today.  She is on amlodipine.      Past Medical History:  Diagnosis Date   Hypertension    Pt denies, is not treated    There are no problems to display for this patient.   History reviewed. No pertinent surgical history.   OB History   No obstetric history on file.     History reviewed. No pertinent family history.  Social History   Tobacco Use   Smoking status: Never   Smokeless tobacco: Never  Substance Use Topics   Alcohol use: Yes    Comment: occ   Drug use: Not Currently    Home Medications Prior to Admission medications   Medication Sig Start Date End Date Taking? Authorizing Provider  amLODipine (NORVASC) 5 MG tablet Take 1 tablet (5 mg total) by mouth daily. 07/23/20   Benjiman Core, MD  HYDROcodone-acetaminophen (NORCO/VICODIN) 5-325 MG tablet Take 1 tablet by mouth every 4 (four) hours as needed. 04/20/17   Jacalyn Lefevre, MD   ibuprofen (ADVIL,MOTRIN) 600 MG tablet Take 1 tablet (600 mg total) by mouth every 6 (six) hours as needed. 04/20/17   Jacalyn Lefevre, MD  naproxen (NAPROSYN) 500 MG tablet Take 1 tablet (500 mg total) by mouth 2 (two) times daily. 03/05/18   Maxwell Caul, PA-C    Allergies    Patient has no known allergies.  Review of Systems   Review of Systems  Constitutional:  Negative for chills, diaphoresis, fatigue and fever.  HENT:  Negative for congestion, rhinorrhea and sneezing.   Eyes: Negative.   Respiratory:  Positive for shortness of breath. Negative for cough and chest tightness.   Cardiovascular:  Positive for chest pain. Negative for leg swelling.  Gastrointestinal:  Negative for abdominal pain, blood in stool, diarrhea, nausea and vomiting.  Genitourinary:  Negative for difficulty urinating, flank pain, frequency and hematuria.  Musculoskeletal:  Positive for back pain. Negative for arthralgias.  Skin:  Negative for rash.  Neurological:  Negative for dizziness, speech difficulty, weakness, numbness and headaches.   Physical Exam Updated Vital Signs BP (!) 173/108 (BP Location: Right Arm)   Pulse (!) 59   Temp 98.3 F (36.8 C) (Oral)   Resp 16   Ht 5\' 2"  (1.575 m)   Wt 81.2 kg   LMP 07/30/2014   SpO2  100%   BMI 32.74 kg/m   Physical Exam Constitutional:      Appearance: She is well-developed.  HENT:     Head: Normocephalic and atraumatic.  Eyes:     Pupils: Pupils are equal, round, and reactive to light.  Cardiovascular:     Rate and Rhythm: Normal rate and regular rhythm.     Heart sounds: Normal heart sounds.  Pulmonary:     Effort: Pulmonary effort is normal. No respiratory distress.     Breath sounds: Normal breath sounds. No wheezing or rales.  Chest:     Chest wall: Tenderness (Positive reproducible tenderness to the right chest wall and right upper back, no crepitus or deformity) present.  Abdominal:     General: Bowel sounds are normal.      Palpations: Abdomen is soft.     Tenderness: There is no abdominal tenderness. There is no guarding or rebound.  Musculoskeletal:        General: Normal range of motion.     Cervical back: Normal range of motion and neck supple.     Comments: No edema or calf tenderness  Lymphadenopathy:     Cervical: No cervical adenopathy.  Skin:    General: Skin is warm and dry.     Findings: No rash.  Neurological:     Mental Status: She is alert and oriented to person, place, and time.    ED Results / Procedures / Treatments   Labs (all labs ordered are listed, but only abnormal results are displayed) Labs Reviewed  CBC WITH DIFFERENTIAL/PLATELET - Abnormal; Notable for the following components:      Result Value   Platelets 432 (*)    All other components within normal limits  COMPREHENSIVE METABOLIC PANEL - Abnormal; Notable for the following components:   Calcium 8.8 (*)    Albumin 3.4 (*)    Total Bilirubin 0.1 (*)    All other components within normal limits  D-DIMER, QUANTITATIVE  TROPONIN I (HIGH SENSITIVITY)    EKG EKG Interpretation  Date/Time:  Saturday April 09 2021 18:56:48 EDT Ventricular Rate:  66 PR Interval:  132 QRS Duration: 82 QT Interval:  402 QTC Calculation: 421 R Axis:   31 Text Interpretation: Normal sinus rhythm Normal ECG since last tracing no significant change Confirmed by Rolan Bucco [phone]) on 04/09/2021 8:53:23 PM  Radiology DG Chest 2 View  Result Date: 04/09/2021 CLINICAL DATA:  Chest pain. EXAM: CHEST - 2 VIEW COMPARISON:  None. FINDINGS: The cardiomediastinal contours are normal. The lungs are clear. Pulmonary vasculature is normal. No consolidation, pleural effusion, or pneumothorax. No acute osseous abnormalities are seen. IMPRESSION: Negative radiographs of the chest. Electronically Signed   By: Narda Rutherford M.D.   On: 04/09/2021 21:45   CT Angio Chest Aorta w/CM &/OR wo/CM  Result Date: 04/09/2021 CLINICAL DATA:  Chest pain or back  pain, aortic dissection suspected Patient reports right-sided chest pain for 3 days radiating into the right arm and back. Shortness of breath. EXAM: CT ANGIOGRAPHY CHEST WITH CONTRAST TECHNIQUE: Multidetector CT imaging of the chest was performed using the standard protocol during bolus administration of intravenous contrast. Multiplanar CT image reconstructions and MIPs were obtained to evaluate the vascular anatomy. CONTRAST:  OMNIPAQUE IOHEXOL 350 MG/ML SOLN COMPARISON:  Radiograph earlier today. FINDINGS: Cardiovascular: Mild fusiform aneurysmal dilatation of the ascending aorta at 4 cm. No aortic hematoma, dissection, or evidence of acute aortic syndrome. No large vessel vasculitis. Conventional branching pattern from the aortic arch.  Aortic branch vessels are patent. No central pulmonary embolus to the segmental level. The heart is normal in size. No pericardial effusion. Mediastinum/Nodes: No mediastinal or hilar adenopathy. No thyroid nodule. No enlarged axillary lymph nodes. Decompressed esophagus. Lungs/Pleura: 4 mm right middle lobe pulmonary nodule, series 3, image 32. This is stable from lung bases on abdominal CT 04/16/2016, and considered benign. No further follow-up is needed. No focal airspace disease. No pleural fluid. No findings of pulmonary edema. Occasional thin walled cysts in the right lower lobe, typically post infectious or inflammatory. Upper Abdomen: Low-density lesion in the inferior right lobe of the liver is partially included, but corresponds to assessed on prior abdominal CT. No acute upper abdominal findings. Musculoskeletal: There are no acute or suspicious osseous abnormalities. Review of the MIP images confirms the above findings. IMPRESSION: 1. No aortic dissection or acute aortic abnormality. No central pulmonary embolus. 2. Mild fusiform aneurysmal dilatation of the ascending aorta at 4 cm. Recommend annual imaging followup by CTA or MRA. This recommendation follows  2010 ACCF/AHA/AATS/ACR/ASA/SCA/SCAI/SIR/STS/SVM Guidelines for the Diagnosis and Management of Patients with Thoracic Aortic Disease. Circulation. 2010; 121: Z169-C789. Aortic aneurysm NOS (ICD10-I71.9) 3. No acute intrathoracic abnormality. Electronically Signed   By: Narda Rutherford M.D.   On: 04/09/2021 23:25    Procedures Procedures   Medications Ordered in ED Medications  iohexol (OMNIPAQUE) 350 MG/ML injection 100 mL (100 mLs Intravenous Contrast Given 04/09/21 2310)    ED Course  I have reviewed the triage vital signs and the nursing notes.  Pertinent labs & imaging results that were available during my care of the patient were reviewed by me and considered in my medical decision making (see chart for details).    MDM Rules/Calculators/A&P                           Patient is a 55 year old female who presents with chest pain.  Its atypical in nature.  It is somewhat pleuritic.  It is also going through to her mid back.  Her D-dimer was negative.  A CTA was performed of her chest which showed no dissection.  No PE.  She does incidentally have a thoracic aneurysm that will need annual follow-up.  Her labs are nonconcerning.  Her troponin is negative.  It has been constant for 3 days so repeat was not performed.  Her EKG does not show any ischemic changes.  Her pain is somewhat reproducible on palpation and may be musculoskeletal.  She was discharged home in good condition.  She was advised about the need for annual follow-up of her aneurysm.  It was stressed the importance of taking her blood pressure medication.  She is followed at the community clinic in [facility].  She will make a follow-up appointment this week for recheck.  Return precautions were given. Final Clinical Impression(s) / ED Diagnoses Final diagnoses:  Nonspecific chest pain  Thoracic aortic aneurysm without rupture [facility])    Rx / DC Orders ED Discharge Orders     None        Rolan Bucco, MD 04/09/21  2345

## 2021-04-09 NOTE — ED Notes (Signed)
ED Provider at bedside. 

## 2021-06-27 ENCOUNTER — Other Ambulatory Visit: Payer: Self-pay

## 2021-06-27 ENCOUNTER — Emergency Department (HOSPITAL_BASED_OUTPATIENT_CLINIC_OR_DEPARTMENT_OTHER): Payer: 59

## 2021-06-27 ENCOUNTER — Emergency Department (HOSPITAL_BASED_OUTPATIENT_CLINIC_OR_DEPARTMENT_OTHER)
Admission: EM | Admit: 2021-06-27 | Discharge: 2021-06-28 | Disposition: A | Discharge status: Home / Self Care | Payer: 59 | Attending: Emergency Medicine | Admitting: Emergency Medicine

## 2021-06-27 ENCOUNTER — Encounter (HOSPITAL_BASED_OUTPATIENT_CLINIC_OR_DEPARTMENT_OTHER): Payer: Self-pay | Admitting: Emergency Medicine

## 2021-06-27 DIAGNOSIS — R109 Unspecified abdominal pain: Secondary | ICD-10-CM | POA: Diagnosis not present

## 2021-06-27 DIAGNOSIS — M79642 Pain in left hand: Secondary | ICD-10-CM | POA: Insufficient documentation

## 2021-06-27 DIAGNOSIS — Z79899 Other long term (current) drug therapy: Secondary | ICD-10-CM | POA: Insufficient documentation

## 2021-06-27 DIAGNOSIS — R0789 Other chest pain: Secondary | ICD-10-CM | POA: Insufficient documentation

## 2021-06-27 DIAGNOSIS — R202 Paresthesia of skin: Secondary | ICD-10-CM | POA: Insufficient documentation

## 2021-06-27 DIAGNOSIS — M25562 Pain in left knee: Secondary | ICD-10-CM | POA: Insufficient documentation

## 2021-06-27 DIAGNOSIS — I10 Essential (primary) hypertension: Secondary | ICD-10-CM | POA: Insufficient documentation

## 2021-06-27 DIAGNOSIS — R0602 Shortness of breath: Secondary | ICD-10-CM | POA: Diagnosis not present

## 2021-06-27 LAB — BASIC METABOLIC PANEL
Anion gap: 5 (ref 5–15)
BUN: 17 mg/dL (ref 6–20)
CO2: 26 mmol/L (ref 22–32)
Calcium: 9 mg/dL (ref 8.9–10.3)
Chloride: 108 mmol/L (ref 98–111)
Creatinine, Ser: 0.92 mg/dL (ref 0.44–1.00)
GFR, Estimated: >60 mL/min (ref >60)
Glucose, Bld: 101 mg/dL — ABNORMAL HIGH (ref 70–99)
Potassium: 3.8 mmol/L (ref 3.5–5.1)
Sodium: 139 mmol/L (ref 135–145)

## 2021-06-27 LAB — CBC
HCT: 39.5 % (ref 36.0–46.0)
Hemoglobin: 12.6 g/dL (ref 12.0–15.0)
MCH: 29.2 pg (ref 26.0–34.0)
MCHC: 31.9 g/dL (ref 30.0–36.0)
MCV: 91.4 fL (ref 80.0–100.0)
Platelets: 377 10*3/uL (ref 150–400)
RBC: 4.32 MIL/uL (ref 3.87–5.11)
RDW: 14.6 % (ref 11.5–15.5)
WBC: 9.8 10*3/uL (ref 4.0–10.5)
nRBC: 0 % (ref 0.0–0.2)

## 2021-06-27 LAB — D-DIMER, QUANTITATIVE: D-Dimer, Quant: <0.27 ug/mL-FEU (ref 0.00–0.50)

## 2021-06-27 LAB — TROPONIN I (HIGH SENSITIVITY)
Troponin I (High Sensitivity): 4 ng/L (ref <18)
Troponin I (High Sensitivity): 3 ng/L (ref <18)

## 2021-06-27 MED ORDER — IOHEXOL 350 MG/ML SOLN
100.0000 mL | Freq: Once | INTRAVENOUS | Status: AC | PRN
  Administered 2021-06-27: 100 mL via INTRAVENOUS

## 2021-06-27 MED ORDER — KETOROLAC TROMETHAMINE 30 MG/ML IJ SOLN
30.0000 mg | Freq: Once | INTRAMUSCULAR | Status: AC
  Administered 2021-06-27: 30 mg via INTRAVENOUS
  Filled 2021-06-27: qty 1

## 2021-06-27 NOTE — ED Provider Notes (Signed)
[facility] EMERGENCY DEPARTMENT Provider Note   CSN: [id number] Arrival date & time: 06/27/21  1751     History Chief Complaint  Patient presents with   Chest Pain    Kathleen Drake is a 55 y.o. female.  Patient with a history of hypertension and known thoracic aortic aneurysm here with chest pain for the past 2 days.  She reports constant central chest pain that is worse with palpation and movement and inspiration.  She is concerned because she was diagnosed with an aortic aneurysm in July.  States compliance with her blood pressure medication.  Denies any radiation of the pain to her arm, neck or back.  Some shortness of breath.  No nausea or vomiting.  No cough or fever.  Pain is not exertional but is somewhat worse with deep breathing.  Denies any abdominal pain.  Denies any nausea or vomiting.  Denies any headache or visual changes. States last week, she had pain, cramps in her left hand and difficulty closing it for several hours and could not close it all the way but this has improved.  Denies any weakness, numbness or tingling. Patient complains of atraumatic left knee pain for several days.  No fever  The history is provided by the patient.  Chest Pain Associated symptoms: no abdominal pain, no dizziness, no headache, no nausea, no shortness of breath, no vomiting and no weakness       Past Medical History:  Diagnosis Date   Hypertension    Pt denies, is not treated    There are no problems to display for this patient.   History reviewed. No pertinent surgical history.   OB History   No obstetric history on file.     History reviewed. No pertinent family history.  Social History   Tobacco Use   Smoking status: Never   Smokeless tobacco: Never  Substance Use Topics   Alcohol use: Yes    Comment: occ   Drug use: Not Currently    Home Medications Prior to Admission medications   Medication Sig Start Date End Date Taking? Authorizing  Provider  amLODipine (NORVASC) 5 MG tablet Take 1 tablet (5 mg total) by mouth daily. 07/23/20   Benjiman Core, MD  HYDROcodone-acetaminophen (NORCO/VICODIN) 5-325 MG tablet Take 1 tablet by mouth every 4 (four) hours as needed. 04/20/17   Jacalyn Lefevre, MD  ibuprofen (ADVIL,MOTRIN) 600 MG tablet Take 1 tablet (600 mg total) by mouth every 6 (six) hours as needed. 04/20/17   Jacalyn Lefevre, MD  naproxen (NAPROSYN) 500 MG tablet Take 1 tablet (500 mg total) by mouth 2 (two) times daily. 03/05/18   Maxwell Caul, PA-C    Allergies    Patient has no known allergies.  Review of Systems   Review of Systems  Constitutional:  Negative for activity change and appetite change.  HENT:  Negative for congestion.   Respiratory:  Positive for chest tightness. Negative for shortness of breath.   Cardiovascular:  Positive for chest pain.  Gastrointestinal:  Negative for abdominal pain, nausea and vomiting.  Genitourinary:  Negative for dysuria and hematuria.  Musculoskeletal:  Negative for arthralgias and myalgias.  Skin:  Negative for rash.  Neurological:  Negative for dizziness, weakness and headaches.   all other systems are negative except as noted in the HPI and PMH.   Physical Exam Updated Vital Signs BP (!) 125/95   Pulse 71   Temp 98.1 F (36.7 C) (Oral)   Resp (!) 22  Ht 5\' 2"  (1.575 m)   Wt 83.5 kg   LMP 07/30/2014   SpO2 100%   BMI 33.65 kg/m   Physical Exam Vitals and nursing note reviewed.  Constitutional:      General: She is not in acute distress.    Appearance: She is well-developed.  HENT:     Head: Normocephalic and atraumatic.     Mouth/Throat:     Pharynx: No oropharyngeal exudate.  Eyes:     Conjunctiva/sclera: Conjunctivae normal.     Pupils: Pupils are equal, round, and reactive to light.  Neck:     Comments: No meningismus. Cardiovascular:     Rate and Rhythm: Normal rate and regular rhythm.     Heart sounds: Normal heart sounds. No murmur  heard. Pulmonary:     Effort: Pulmonary effort is normal. No respiratory distress.     Breath sounds: Normal breath sounds.     Comments: Reproducible central chest tenderness Chest:     Chest wall: Tenderness present.  Abdominal:     Palpations: Abdomen is soft.     Tenderness: There is no abdominal tenderness. There is no guarding or rebound.  Musculoskeletal:        General: Tenderness present. Normal range of motion.     Cervical back: Normal range of motion and neck supple.     Comments: Left knee is tender anteriorly.  Full range of motion.  Flexion extension are intact.  No ligament laxity  Skin:    General: Skin is warm.     Capillary Refill: Capillary refill takes less than 2 seconds.     Findings: No rash.  Neurological:     General: No focal deficit present.     Mental Status: She is alert and oriented to person, place, and time. Mental status is at baseline.     Cranial Nerves: No cranial nerve deficit.     Motor: No abnormal muscle tone.     Coordination: Coordination normal.     Comments:  5/5 strength throughout. CN 2-12 intact.Equal grip strength.   Psychiatric:        Behavior: Behavior normal.    ED Results / Procedures / Treatments   Labs (all labs ordered are listed, but only abnormal results are displayed) Labs Reviewed  BASIC METABOLIC PANEL - Abnormal; Notable for the following components:      Result Value   Glucose, Bld 101 (*)    All other components within normal limits  CBC  D-DIMER, QUANTITATIVE  TROPONIN I (HIGH SENSITIVITY)  TROPONIN I (HIGH SENSITIVITY)    EKG EKG Interpretation  Date/Time:  Monday June 27 2021 18:04:58 EDT Ventricular Rate:  67 PR Interval:  152 QRS Duration: 82 QT Interval:  402 QTC Calculation: 424 R Axis:   44 Text Interpretation: Normal sinus rhythm Normal ECG No significant change since last tracing Confirmed by [id number] (Alvira Monday) on 06/27/2021 10:11:37 PM  Radiology DG Chest 2 View  Result Date:  06/27/2021 CLINICAL DATA:  Chest pain. EXAM: CHEST - 2 VIEW COMPARISON:  Chest x-ray and chest CT 04/09/2021. FINDINGS: The heart size and mediastinal contours are within normal limits. Both lungs are clear. The visualized skeletal structures are unremarkable. IMPRESSION: No active cardiopulmonary disease. Electronically Signed   By: 04/11/2021 M.D.   On: 06/27/2021 18:53   CT Head Wo Contrast  Result Date: 06/27/2021 CLINICAL DATA:  Neuro deficit, acute stroke suspected. EXAM: CT HEAD WITHOUT CONTRAST TECHNIQUE: Contiguous axial images were obtained from the base  of the skull through the vertex without intravenous contrast. COMPARISON:  None. FINDINGS: Brain: No evidence of acute large vascular territory infarction, hemorrhage, hydrocephalus, extra-axial collection or mass lesion/mass effect. Vascular: No hyperdense vessel. Atherosclerotic calcifications of the internal carotid and vertebral arteries at the skull base. Skull: Normal. Negative for fracture or focal lesion. Sinuses/Orbits: Visualized portions of the paranasal sinuses mastoid air cells are predominantly clear. Orbits are unremarkable. Other: None. IMPRESSION: No acute intracranial abnormality. Electronically Signed   By: Maudry Mayhew M.D.   On: 06/27/2021 23:57   DG Knee Complete 4 Views Left  Result Date: 06/28/2021 CLINICAL DATA:  Left knee pain. EXAM: LEFT KNEE - COMPLETE 4+ VIEW COMPARISON:  None. FINDINGS: No evidence of fracture, dislocation, or joint effusion. No evidence of arthropathy or other focal bone abnormality. Soft tissues are unremarkable. IMPRESSION: Negative. Electronically Signed   By: Darliss Cheney M.D.   On: 06/28/2021 00:00   CT Angio Chest/Abd/Pel for Dissection W and/or Wo Contrast  Result Date: 06/28/2021 CLINICAL DATA:  Chest pain on inspiration for 2 days, history of thoracic aortic aneurysm, follow-up exam EXAM: CT ANGIOGRAPHY CHEST, ABDOMEN AND PELVIS TECHNIQUE: Non-contrast CT of the chest was  initially obtained. Multidetector CT imaging through the chest, abdomen and pelvis was performed using the standard protocol during bolus administration of intravenous contrast. Multiplanar reconstructed images and MIPs were obtained and reviewed to evaluate the vascular anatomy. CONTRAST:  OMNIPAQUE IOHEXOL 350 MG/ML SOLN COMPARISON:  04/09/2021 FINDINGS: CTA CHEST FINDINGS Cardiovascular: Initial precontrast images were obtained and reveal no hyperdense crescent to suggest acute aortic injury. Post-contrast images demonstrate the thoracic aorta to be mildly prominent measuring 3.9 cm in greatest dimension. This is stable in appearance from the prior exam. No findings to suggest dissection are seen. No cardiac enlargement is seen. No coronary calcifications are noted. No pulmonary emboli are seen. Mediastinum/Nodes: Thoracic inlet is within normal limits. No sizable hilar or mediastinal adenopathy is noted. The esophagus as visualized is within normal limits. Lungs/Pleura: Lungs are well aerated bilaterally. No focal infiltrate or sizable effusion is seen. Stable 4 mm right middle lobe nodule is noted. This is stable dating back to 2017 and consistent with a benign etiology. No further follow-up is recommended. Stable thin walled cysts are noted in the lung bases. Musculoskeletal: No chest wall abnormality. No acute or significant osseous findings. Review of the MIP images confirms the above findings. CTA ABDOMEN AND PELVIS FINDINGS VASCULAR Aorta: Normal caliber aorta without aneurysm, dissection, vasculitis or significant stenosis. Celiac: Patent without evidence of aneurysm, dissection, vasculitis or significant stenosis. SMA: Patent without evidence of aneurysm, dissection, vasculitis or significant stenosis. Renals: Both renal arteries are patent without evidence of aneurysm, dissection, vasculitis, fibromuscular dysplasia or significant stenosis. IMA: Patent without evidence of aneurysm, dissection,  vasculitis or significant stenosis. Inflow: Iliacs are within normal limits. Veins: No specific venous abnormality is seen. Review of the MIP images confirms the above findings. NON-VASCULAR Hepatobiliary: No focal liver abnormality is seen. No gallstones, gallbladder wall thickening, or biliary dilatation. Pancreas: Unremarkable. No pancreatic ductal dilatation or surrounding inflammatory changes. Spleen: Normal in size without focal abnormality. Adrenals/Urinary Tract: Adrenal glands are within normal limits. Kidneys demonstrate a normal enhancement pattern bilaterally. Cyst is noted in the lower pole of the right kidney now measuring 13 mm in size increased from the prior exam. No obstructive changes are noted. The bladder is partially distended. Stomach/Bowel: Scattered diverticular change of the colon is noted without evidence of diverticulitis. No obstructive changes  of the colon are seen. The appendix is well visualized and within normal limits. The small bowel and stomach are unremarkable. Lymphatic: Sizable lymphadenopathy is seen. Reproductive: Uterus and bilateral adnexa are unremarkable. Other: No abdominal wall hernia or abnormality. No abdominopelvic ascites. Musculoskeletal: No acute or significant osseous findings. Review of the MIP images confirms the above findings. IMPRESSION: Stable appearing dilatation of the ascending aorta 3.9 cm on today's exam. No evidence of dissection is seen. Abdominal aorta and vessels are within normal limits. Stable 4 mm right middle lobe nodule dating back to 2017 consistent with a benign etiology. No further follow-up is recommended. Right renal cyst. Diverticulosis without diverticulitis. Electronically Signed   By: Alcide Clever M.D.   On: 06/28/2021 00:10    Procedures Procedures   Medications Ordered in ED Medications  ketorolac (TORADOL) 30 MG/ML injection 30 mg (has no administration in time range)    ED Course  I have reviewed the triage vital signs  and the nursing notes.  Pertinent labs & imaging results that were available during my care of the patient were reviewed by me and considered in my medical decision making (see chart for details).    MDM Rules/Calculators/A&P                          2 days of constant chest pain with known thoracic aortic aneurysm.  Pain is reproducible.  EKG is sinus rhythm without acute ischemia.  Chest x-ray is unchanged.  Troponin negative x2.  D-dimer is negative.  Low suspicion for ACS or PE.  CT scan obtained to evaluate for known thoracic aneurysm. Aneurysm appears stable without evidence of dissection.  Her chest pain is reproducible likely musculoskeletal in origin.  Knee x-ray is negative.  Patient is tolerating p.o. and ambulatory.  Her chest pain is reproducible and atypical for ACS or pulmonary embolism.  Her thoracic aneurysm appears to be stable.  Last week she had difficulty closing her left hand for several hours associated with pain and numbness and tingling.  This has resolved and she has normal neurological exam today.  CT head is normal.  Low suspicion for CVA or TIA.  Will treat with anti-inflammatories for suspected musculoskeletal pain.  This will also help her knee pain.  No evidence of septic joint.  Return to the ED if chest pain becomes exertional, associated shortness of breath, nausea, vomiting, sweating, any other concerns   Final Clinical Impression(s) / ED Diagnoses Final diagnoses:  Atypical chest pain    Rx / DC Orders ED Discharge Orders     None        Anders Hohmann, Jeannett Senior, MD 06/28/21 [phone]

## 2021-06-27 NOTE — ED Triage Notes (Signed)
Pt arrives pov with c/o CP x 2 days, reports pain with inspiration. Pt endorses recent dx with Aortic aneurysm. Pt ambulatory to triage

## 2021-06-27 NOTE — ED Notes (Signed)
Patient transported to CT and X ray 

## 2021-06-28 MED ORDER — NAPROXEN 500 MG PO TABS: 500.0000 mg | ORAL_TABLET | Freq: Two times a day (BID) | ORAL | 0 refills | Status: AC

## 2021-06-28 NOTE — ED Notes (Signed)
Patient verbalizes understanding of discharge instructions. Opportunity for questioning and answers were provided. Armband removed by staff, pt discharged from ED. Ambulated out to lobby  

## 2021-06-28 NOTE — Discharge Instructions (Signed)
Your testing is negative for heart attack or blood clot in the lung.  Your thoracic aneurysm appears stable and should be followed every year by your doctor.  Take the anti-inflammatory as prescribed.  Return to the ED with increased chest pain especially with exertion, shortness of breath, nausea, vomiting, sweating, any other concerns.

## 2021-11-12 ENCOUNTER — Emergency Department (HOSPITAL_BASED_OUTPATIENT_CLINIC_OR_DEPARTMENT_OTHER): Payer: Self-pay

## 2021-11-12 ENCOUNTER — Other Ambulatory Visit: Payer: Self-pay

## 2021-11-12 ENCOUNTER — Encounter (HOSPITAL_BASED_OUTPATIENT_CLINIC_OR_DEPARTMENT_OTHER): Payer: Self-pay | Admitting: Emergency Medicine

## 2021-11-12 ENCOUNTER — Emergency Department (HOSPITAL_BASED_OUTPATIENT_CLINIC_OR_DEPARTMENT_OTHER)
Admission: EM | Admit: 2021-11-12 | Discharge: 2021-11-12 | Disposition: A | Discharge status: Home / Self Care | Payer: Self-pay | Attending: Emergency Medicine | Admitting: Emergency Medicine

## 2021-11-12 DIAGNOSIS — R079 Chest pain, unspecified: Secondary | ICD-10-CM | POA: Insufficient documentation

## 2021-11-12 DIAGNOSIS — M25562 Pain in left knee: Secondary | ICD-10-CM | POA: Insufficient documentation

## 2021-11-12 DIAGNOSIS — R0981 Nasal congestion: Secondary | ICD-10-CM | POA: Insufficient documentation

## 2021-11-12 DIAGNOSIS — R0602 Shortness of breath: Secondary | ICD-10-CM | POA: Insufficient documentation

## 2021-11-12 LAB — CBC
HCT: 37.9 % (ref 36.0–46.0)
Hemoglobin: 12 g/dL (ref 12.0–15.0)
MCH: 28.8 pg (ref 26.0–34.0)
MCHC: 31.7 g/dL (ref 30.0–36.0)
MCV: 91.1 fL (ref 80.0–100.0)
Platelets: 466 10*3/uL — ABNORMAL HIGH (ref 150–400)
RBC: 4.16 MIL/uL (ref 3.87–5.11)
RDW: 14.9 % (ref 11.5–15.5)
WBC: 8.7 10*3/uL (ref 4.0–10.5)
nRBC: 0 % (ref 0.0–0.2)

## 2021-11-12 LAB — BASIC METABOLIC PANEL
Anion gap: 11 (ref 5–15)
BUN: 14 mg/dL (ref 6–20)
CO2: 22 mmol/L (ref 22–32)
Calcium: 8.9 mg/dL (ref 8.9–10.3)
Chloride: 103 mmol/L (ref 98–111)
Creatinine, Ser: 0.81 mg/dL (ref 0.44–1.00)
GFR, Estimated: >60 mL/min (ref >60)
Glucose, Bld: 109 mg/dL — ABNORMAL HIGH (ref 70–99)
Potassium: 3.5 mmol/L (ref 3.5–5.1)
Sodium: 136 mmol/L (ref 135–145)

## 2021-11-12 LAB — D-DIMER, QUANTITATIVE: D-Dimer, Quant: <0.27 ug/mL-FEU (ref 0.00–0.50)

## 2021-11-12 LAB — TROPONIN I (HIGH SENSITIVITY): Troponin I (High Sensitivity): 4 ng/L (ref <18)

## 2021-11-12 MED ORDER — ALUM & MAG HYDROXIDE-SIMETH 200-200-20 MG/5ML PO SUSP
30.0000 mL | Freq: Once | ORAL | Status: AC
Start: 2021-11-12 — End: 2021-11-12
  Administered 2021-11-12: 30 mL via ORAL
  Filled 2021-11-12: qty 30

## 2021-11-12 NOTE — ED Notes (Signed)
Patient in Xray

## 2021-11-12 NOTE — ED Notes (Signed)
ED Provider at bedside. Dr. Floyd ?

## 2021-11-12 NOTE — Discharge Instructions (Signed)
Your blood test was negative making this very unlikely to be a blood clot in the leg or the lungs.  Please follow-up with your family doctor in the office. ? ?Try pepcid or tagamet up to twice a day.  Try to avoid things that may make this worse, most commonly these are spicy foods tomato based products fatty foods chocolate and peppermint.  Alcohol and tobacco can also make this worse.  Return to the emergency department for sudden worsening pain fever or inability to eat or drink. ? ?

## 2021-11-12 NOTE — ED Triage Notes (Signed)
Pt arrives pov, ambulatory to triage, c/o dyspnea with exertion x 3 weeks,  left knee pain x 4 days and right great toe pain x 1 month, denies injury.  ?

## 2021-11-12 NOTE — ED Provider Notes (Signed)
?[facility] EMERGENCY DEPARTMENT ?Provider Note ? ? ?CSN: [id number] ?Arrival date & time: 11/12/21  1620 ? ?  ? ?History ? ?Chief Complaint  ?Patient presents with  ? Shortness of Breath  ? ? ?Telisha Zawadzki is a 56 y.o. female. ? ?55 yo F with a chief complaints of chest pain.  This been going on for about 3 weeks now.  Physical burning in her chest.  She thought maybe it was reflux but his insurance came to be evaluated.  She also tells me that she has a history of an aneurysm somewhere in her body and was told it was the big blood vessel.  She denies cough or fever.  Has had some congestion especially when she lays back flat ? ? ?Shortness of Breath ? ?  ? ?Home Medications ?Prior to Admission medications   ?Medication Sig Start Date End Date Taking? Authorizing Provider  ?amLODipine (NORVASC) 5 MG tablet Take 1 tablet (5 mg total) by mouth daily. 07/23/20   Benjiman Core, MD  ?HYDROcodone-acetaminophen (NORCO/VICODIN) 5-325 MG tablet Take 1 tablet by mouth every 4 (four) hours as needed. 04/20/17   Jacalyn Lefevre, MD  ?ibuprofen (ADVIL,MOTRIN) 600 MG tablet Take 1 tablet (600 mg total) by mouth every 6 (six) hours as needed. 04/20/17   Jacalyn Lefevre, MD  ?naproxen (NAPROSYN) 500 MG tablet Take 1 tablet (500 mg total) by mouth 2 (two) times daily with a meal. 06/28/21   Glynn Octave, MD  ?   ? ?Allergies    ?Patient has no known allergies.   ? ?Review of Systems   ?Review of Systems  ?Respiratory:  Positive for shortness of breath.   ? ?Physical Exam ?Updated Vital Signs ?BP 119/77   Pulse 72   Temp 98 ?F (36.7 ?C) (Oral)   Resp (!) 21   Ht 5\' 2"  (1.575 m)   Wt 78.5 kg   LMP 07/30/2014   SpO2 98%   BMI 31.64 kg/m?  ?Physical Exam ?Vitals and nursing note reviewed.  ?Constitutional:   ?   General: She is not in acute distress. ?   Appearance: She is well-developed. She is not diaphoretic.  ?HENT:  ?   Head: Normocephalic and atraumatic.  ?Eyes:  ?   Pupils: Pupils are equal, round,  and reactive to light.  ?Cardiovascular:  ?   Rate and Rhythm: Normal rate and regular rhythm.  ?   Heart sounds: No murmur heard. ?  No friction rub. No gallop.  ?Pulmonary:  ?   Effort: Pulmonary effort is normal.  ?   Breath sounds: No wheezing or rales.  ?Abdominal:  ?   General: There is no distension.  ?   Palpations: Abdomen is soft.  ?   Tenderness: There is no abdominal tenderness.  ?Musculoskeletal:     ?   General: No tenderness.  ?   Cervical back: Normal range of motion and neck supple.  ?Skin: ?   General: Skin is warm and dry.  ?Neurological:  ?   Mental Status: She is alert and oriented to person, place, and time.  ?Psychiatric:     ?   Behavior: Behavior normal.  ? ? ?ED Results / Procedures / Treatments   ?Labs ?(all labs ordered are listed, but only abnormal results are displayed) ?Labs Reviewed  ?BASIC METABOLIC PANEL - Abnormal; Notable for the following components:  ?    Result Value  ? Glucose, Bld 109 (*)   ? All other components within normal limits  ?  CBC - Abnormal; Notable for the following components:  ? Platelets 466 (*)   ? All other components within normal limits  ?D-DIMER, QUANTITATIVE  ?TROPONIN I (HIGH SENSITIVITY)  ? ? ?EKG ?EKG Interpretation ? ?Date/Time:  Saturday November 12 2021 16:30:44 EST ?Ventricular Rate:  65 ?PR Interval:  150 ?QRS Duration: 78 ?QT Interval:  398 ?QTC Calculation: 413 ?R Axis:   36 ?Text Interpretation: Normal sinus rhythm Normal ECG No significant change since last tracing Confirmed by Melene Plan [phone]) on 11/12/2021 6:14:20 PM ? ?Radiology ?DG Chest 2 View ? ?Result Date: 11/12/2021 ?CLINICAL DATA:  shob EXAM: CHEST - 2 VIEW COMPARISON:  June 27, 2021 FINDINGS: The cardiomediastinal silhouette is unchanged in contour. No pleural effusion. No pneumothorax. No acute pleuroparenchymal abnormality. Visualized abdomen is unremarkable. IMPRESSION: No acute cardiopulmonary abnormality. Electronically Signed   By: Meda Klinefelter M.D.   On: 11/12/2021 16:53   ? ?DG Knee Complete 4 Views Left ? ?Result Date: 11/12/2021 ?CLINICAL DATA:  Left knee pain for a couple of days.  No injury. EXAM: LEFT KNEE - COMPLETE 4+ VIEW COMPARISON:  None. FINDINGS: No evidence of fracture, dislocation, or joint effusion. No evidence of arthropathy or other focal bone abnormality. Soft tissues are unremarkable. IMPRESSION: Negative. Electronically Signed   By: Amie Portland M.D.   On: 11/12/2021 18:52   ? ?Procedures ?Procedures  ? ? ?Medications Ordered in ED ?Medications  ?alum & mag hydroxide-simeth (MAALOX/MYLANTA) 200-200-20 MG/5ML suspension 30 mL (30 mLs Oral Given 11/12/21 1849)  ? ? ?ED Course/ Medical Decision Making/ A&P ?  ?                        ?Medical Decision Making ?Amount and/or Complexity of Data Reviewed ?Labs: ordered. ?Radiology: ordered. ? ?Risk ?OTC drugs. ? ? ?56 yo F with a chief complaints of chest discomfort and shortness of breath.  This been going on for about 3 weeks now.  Described as a burning sensation to the chest.  She has a significant past medical history of an ascending aortic aneurysm.  She denies radiation of pain.  Seems to be more focused on shortness of breath is the primary symptom.  She later brings up that she drinks alcohol fairly commonly.  She is wondering it this has irritated her stomach. ? ?She has a benign exam.  Her lab work is resulted without anemia without electrolyte abnormality chest x-ray independently interpreted by me without focal infiltrate or pneumothorax. ? ?She has no concerning risk factors for pulmonary embolism however she is endorsing some difficulty breathing and I am unable to use the [facility] rule due to her age.  We will send off a D-dimer. ? ?As I was leaving the room the patient mentioned that she was having some left knee pain.  Seems musculoskeletal on physical exam.  She is requesting a plain film denies any trauma. ? ?Plain film of the knee independently interpreted by me without fracture or dislocation.  D-dimer is  negative.  Will discharge patient home.  PCP follow-up. ? ?7:14 PM:  I have discussed the diagnosis/risks/treatment options with the patient.  Evaluation and diagnostic testing in the emergency department does not suggest an emergent condition requiring admission or immediate intervention beyond what has been performed at this time.  They will follow up with  PCP. We also discussed returning to the ED immediately if new or worsening sx occur. We discussed the sx which are most concerning (e.g., sudden worsening  pain, fever, inability to tolerate by mouth) that necessitate immediate return. Medications administered to the patient during their visit and any new prescriptions provided to the patient are listed below. ? ?Medications given during this visit ?Medications  ?alum & mag hydroxide-simeth (MAALOX/MYLANTA) 200-200-20 MG/5ML suspension 30 mL (30 mLs Oral Given 11/12/21 1849)  ? ? ? ?The patient appears reasonably screen and/or stabilized for discharge and I doubt any other medical condition or other [facility] requiring further screening, evaluation, or treatment in the ED at this time prior to discharge.  ? ? ? ? ? ? ? ? ?Final Clinical Impression(s) / ED Diagnoses ?Final diagnoses:  ?Shortness of breath  ?Nonspecific chest pain  ? ? ?Rx / DC Orders ?ED Discharge Orders   ? ? None  ? ?  ? ? ?  ?Melene Plan, DO ?11/12/21 1914 ? ?

## 2021-11-28 ENCOUNTER — Emergency Department (HOSPITAL_BASED_OUTPATIENT_CLINIC_OR_DEPARTMENT_OTHER): Payer: Self-pay

## 2021-11-28 ENCOUNTER — Emergency Department (HOSPITAL_BASED_OUTPATIENT_CLINIC_OR_DEPARTMENT_OTHER)
Admission: EM | Admit: 2021-11-28 | Discharge: 2021-11-28 | Disposition: A | Discharge status: Home / Self Care | Payer: Self-pay | Attending: Emergency Medicine | Admitting: Emergency Medicine

## 2021-11-28 ENCOUNTER — Other Ambulatory Visit: Payer: Self-pay

## 2021-11-28 ENCOUNTER — Encounter (HOSPITAL_BASED_OUTPATIENT_CLINIC_OR_DEPARTMENT_OTHER): Payer: Self-pay | Admitting: *Deleted

## 2021-11-28 DIAGNOSIS — Y9301 Activity, walking, marching and hiking: Secondary | ICD-10-CM | POA: Insufficient documentation

## 2021-11-28 DIAGNOSIS — S81011A Laceration without foreign body, right knee, initial encounter: Secondary | ICD-10-CM | POA: Insufficient documentation

## 2021-11-28 DIAGNOSIS — W010XXA Fall on same level from slipping, tripping and stumbling without subsequent striking against object, initial encounter: Secondary | ICD-10-CM | POA: Insufficient documentation

## 2021-11-28 DIAGNOSIS — Z23 Encounter for immunization: Secondary | ICD-10-CM | POA: Insufficient documentation

## 2021-11-28 DIAGNOSIS — W19XXXA Unspecified fall, initial encounter: Secondary | ICD-10-CM

## 2021-11-28 MED ORDER — LIDOCAINE-EPINEPHRINE 2 %-1:200000 IJ SOLN
10.0000 mL | Freq: Once | INTRAMUSCULAR | Status: AC
Start: 2021-11-28 — End: 2021-11-28
  Administered 2021-11-28: 10 mL
  Filled 2021-11-28: qty 20

## 2021-11-28 MED ORDER — TETANUS-DIPHTH-ACELL PERTUSSIS 5-2.5-18.5 LF-MCG/0.5 IM SUSY
0.5000 mL | PREFILLED_SYRINGE | Freq: Once | INTRAMUSCULAR | Status: AC
Start: 2021-11-28 — End: 2021-11-28
  Administered 2021-11-28: 0.5 mL via INTRAMUSCULAR
  Filled 2021-11-28: qty 0.5

## 2021-11-28 NOTE — ED Provider Notes (Signed)
?[facility] EMERGENCY DEPARTMENT ?Provider Note ? ? ?CSN: [id number] ?Arrival date & time: 11/28/21  1246 ? ?  ?History ? ?Chief Complaint  ?Patient presents with  ? Fall  ? Multiple injures  ? ? ?Kathleen Drake is a 56 y.o. female here for evaluation of slip and fall. Denies hitting head, LOC, anticoagulation. Pain to right knee, great toe, left foot, left knee and little finger. Ambulatory PTA. Denies HA, Neck pain, back pain, CP, abd pain, numbness, weakness, syncope/ pre syncope. No meds PTA. Laceration to right anterior knee. ? ?Tetanus not up to date. ? ?HPI ? ?  ? ?Home Medications ?Prior to Admission medications   ?Medication Sig Start Date End Date Taking? Authorizing Provider  ?amLODipine (NORVASC) 5 MG tablet Take 1 tablet (5 mg total) by mouth daily. 07/23/20  Yes Benjiman Core, MD  ?HYDROcodone-acetaminophen (NORCO/VICODIN) 5-325 MG tablet Take 1 tablet by mouth every 4 (four) hours as needed. 04/20/17   Jacalyn Lefevre, MD  ?ibuprofen (ADVIL,MOTRIN) 600 MG tablet Take 1 tablet (600 mg total) by mouth every 6 (six) hours as needed. 04/20/17   Jacalyn Lefevre, MD  ?naproxen (NAPROSYN) 500 MG tablet Take 1 tablet (500 mg total) by mouth 2 (two) times daily with a meal. 06/28/21   Glynn Octave, MD  ?   ? ?Allergies    ?Patient has no known allergies.   ? ?Review of Systems   ?Review of Systems  ?Constitutional: Negative.   ?HENT: Negative.    ?Respiratory: Negative.    ?Cardiovascular: Negative.   ?Gastrointestinal: Negative.   ?Genitourinary: Negative.   ?Musculoskeletal:   ?     Extremity pain  ?Skin: Negative.   ?Neurological: Negative.   ?All other systems reviewed and are negative. ? ?Physical Exam ?Updated Vital Signs ?BP (!) 134/97   Pulse 86   Temp 98.2 ?F (36.8 ?C)   Resp 18   Ht 5\' 2"  (1.575 m)   Wt 78 kg   LMP 07/30/2014   SpO2 98%   BMI 31.46 kg/m?  ?Physical Exam ?Vitals and nursing note reviewed.  ?Constitutional:   ?   General: She is not in acute distress. ?    Appearance: She is well-developed. She is not ill-appearing or toxic-appearing.  ?HENT:  ?   Head: Normocephalic and atraumatic.  ?   Nose: Nose normal.  ?   Mouth/Throat:  ?   Mouth: Mucous membranes are moist.  ?Eyes:  ?   Pupils: Pupils are equal, round, and reactive to light.  ?Cardiovascular:  ?   Rate and Rhythm: Normal rate.  ?   Pulses: Normal pulses.     ?     Radial pulses are 2+ on the right side and 2+ on the left side.  ?     Dorsalis pedis pulses are 2+ on the right side and 2+ on the left side.  ?     Posterior tibial pulses are 2+ on the right side and 2+ on the left side.  ?   Heart sounds: Normal heart sounds.  ?Pulmonary:  ?   Effort: Pulmonary effort is normal. No respiratory distress.  ?   Breath sounds: Normal breath sounds.  ?Abdominal:  ?   General: Bowel sounds are normal. There is no distension.  ?   Palpations: Abdomen is soft.  ?   Tenderness: There is no abdominal tenderness. There is no guarding or rebound.  ?Musculoskeletal:     ?   General: Normal range of motion.  ?  Cervical back: Normal range of motion.  ?   Comments: Diffuse tenderness bilateral anterior knee, bilateral feet.  3.5 cm jagged V-shaped laceration to right anterior knee.  Does not cross over joint line.  No active bleeding or drainage. ?No midline C/T/L tenderness.  Pelvis stable, nontender palpation.  Able to flex and extend all major joints.  Tenderness to dorsum left foot.  Wiggles toes without difficulty.  No shortening or rotation of legs.  Nontender bilateral scaphoid.  Nontender bilateral femur, tib-fib  ?Skin: ?   General: Skin is warm and dry.  ?   Capillary Refill: Capillary refill takes less than 2 seconds.  ?   Findings: Laceration present.  ?   Comments: 3.5 centimeter laceration, V-shaped, jagged right anterior knee.  ?Neurological:  ?   General: No focal deficit present.  ?   Mental Status: She is alert.  ?   Cranial Nerves: Cranial nerves 2-12 are intact.  ?   Sensory: Sensation is intact.  ?   Motor:  Motor function is intact.  ?   Coordination: Coordination is intact.  ?   Gait: Gait is intact.  ?Psychiatric:     ?   Mood and Affect: Mood normal.  ? ? ?ED Results / Procedures / Treatments   ?Labs ?(all labs ordered are listed, but only abnormal results are displayed) ?Labs Reviewed - No data to display ? ?EKG ?None ? ?Radiology ?DG Knee Complete 4 Views Left ? ?Result Date: 11/28/2021 ?CLINICAL DATA:  Left knee pain after fall. EXAM: LEFT KNEE - COMPLETE 4+ VIEW COMPARISON:  November 12, 2021. FINDINGS: No evidence of fracture, dislocation, or joint effusion. No evidence of arthropathy or other focal bone abnormality. Soft tissues are unremarkable. IMPRESSION: Negative. Electronically Signed   By: Lupita RaiderJames  Green Jr M.D.   On: 11/28/2021 15:43  ? ?DG Knee Complete 4 Views Right ? ?Result Date: 11/28/2021 ?CLINICAL DATA:  Left knee pain after fall. EXAM: RIGHT KNEE - COMPLETE 4+ VIEW COMPARISON:  None. FINDINGS: No evidence of fracture, dislocation, or joint effusion. No evidence of arthropathy or other focal bone abnormality. Soft tissues are unremarkable. IMPRESSION: Negative. Electronically Signed   By: Lupita RaiderJames  Green Jr M.D.   On: 11/28/2021 15:45  ? ?DG Hand Complete Left ? ?Result Date: 11/28/2021 ?CLINICAL DATA:  Left hand pain after fall. EXAM: LEFT HAND - COMPLETE 3+ VIEW COMPARISON:  None. FINDINGS: There is no evidence of fracture or dislocation. There is no evidence of arthropathy or other focal bone abnormality. Soft tissues are unremarkable. IMPRESSION: Negative. Electronically Signed   By: Lupita RaiderJames  Green Jr M.D.   On: 11/28/2021 15:46  ? ?DG Foot Complete Left ? ?Result Date: 11/28/2021 ?CLINICAL DATA:  Left foot pain after fall. EXAM: LEFT FOOT - COMPLETE 3+ VIEW COMPARISON:  None. FINDINGS: There is no evidence of fracture or dislocation. There is no evidence of arthropathy or other focal bone abnormality. Soft tissues are unremarkable. IMPRESSION: Negative. Electronically Signed   By: Lupita RaiderJames  Green Jr M.D.    On: 11/28/2021 15:50  ? ?DG Foot Complete Right ? ?Result Date: 11/28/2021 ?CLINICAL DATA:  Right foot pain after fall. EXAM: RIGHT FOOT COMPLETE - 3+ VIEW COMPARISON:  None. FINDINGS: There is no evidence of fracture or dislocation. There is no evidence of arthropathy or other focal bone abnormality. Soft tissues are unremarkable. IMPRESSION: Negative. Electronically Signed   By: Lupita RaiderJames  Green Jr M.D.   On: 11/28/2021 15:48   ? ?Procedures ?Marland Kitchen..Laceration Repair ? ?Date/Time: 11/28/2021 4:49  PM ?Performed by: Ralph Leyden A, PA-C ?Authorized by: Linwood Dibbles, PA-C  ? ?Consent:  ?  Consent obtained:  Verbal ?  Consent given by:  Patient ?  Risks discussed:  Infection, need for additional repair, pain, poor cosmetic result and poor wound healing ?  Alternatives discussed:  No treatment and delayed treatment ?Universal protocol:  ?  Procedure explained and questions answered to patient or proxy's satisfaction: yes   ?  Relevant documents present and verified: yes   ?  Test results available: yes   ?  Imaging studies available: yes   ?  Required blood products, implants, devices, and special equipment available: yes   ?  Site/side marked: yes   ?  Immediately prior to procedure, a time out was called: yes   ?  Patient identity confirmed:  Verbally with patient ?Anesthesia:  ?  Anesthesia method:  Local infiltration ?  Local anesthetic:  Lidocaine 1% WITH epi ?Laceration details:  ?  Location:  Leg ?  Leg location:  R knee ?  Length (cm):  4 ?  Depth (mm):  4 ?Pre-procedure details:  ?  Preparation:  Patient was prepped and draped in usual sterile fashion and imaging obtained to evaluate for foreign bodies ?Exploration:  ?  Limited defect created (wound extended): no   ?  Hemostasis achieved with:  Direct pressure ?  Imaging obtained: x-ray   ?  Imaging outcome: foreign body not noted   ?  Wound exploration: wound explored through full range of motion and entire depth of wound visualized   ?  Wound extent: no  fascia violation noted, no foreign bodies/material noted, no muscle damage noted, no tendon damage noted, no underlying fracture noted and no vascular damage noted   ?  Contaminated: no   ?Treatment:  ?  Area cleansed

## 2021-11-28 NOTE — Discharge Instructions (Signed)
Stitches need to come out in 10 to 14 days ? ?Try to keep her leg straight ? ?Tylenol Motrin as needed for pain ? ?Follow-up with primary care provider ? ?I have written a work note ?

## 2021-11-28 NOTE — ED Triage Notes (Signed)
She was walking to her car and slipped causing a fall. Injury to her right knee, great toe, left foot, knee and pinky finger.  ?

## 2022-01-11 DIAGNOSIS — I7121 Aneurysm of the ascending aorta, without rupture: Secondary | ICD-10-CM | POA: Insufficient documentation

## 2022-01-11 DIAGNOSIS — I10 Essential (primary) hypertension: Secondary | ICD-10-CM | POA: Insufficient documentation

## 2022-02-09 DIAGNOSIS — E785 Hyperlipidemia, unspecified: Secondary | ICD-10-CM | POA: Insufficient documentation

## 2022-04-18 DIAGNOSIS — F321 Major depressive disorder, single episode, moderate: Secondary | ICD-10-CM | POA: Insufficient documentation

## 2023-03-18 ENCOUNTER — Other Ambulatory Visit: Payer: Self-pay

## 2023-03-18 ENCOUNTER — Emergency Department (HOSPITAL_BASED_OUTPATIENT_CLINIC_OR_DEPARTMENT_OTHER): Payer: 59

## 2023-03-18 ENCOUNTER — Emergency Department (HOSPITAL_BASED_OUTPATIENT_CLINIC_OR_DEPARTMENT_OTHER)
Admission: EM | Admit: 2023-03-18 | Discharge: 2023-03-18 | Disposition: A | Discharge status: Home / Self Care | Payer: 59 | Attending: Emergency Medicine | Admitting: Emergency Medicine

## 2023-03-18 ENCOUNTER — Encounter (HOSPITAL_BASED_OUTPATIENT_CLINIC_OR_DEPARTMENT_OTHER): Payer: Self-pay

## 2023-03-18 DIAGNOSIS — I160 Hypertensive urgency: Secondary | ICD-10-CM | POA: Diagnosis not present

## 2023-03-18 DIAGNOSIS — Z79899 Other long term (current) drug therapy: Secondary | ICD-10-CM | POA: Insufficient documentation

## 2023-03-18 DIAGNOSIS — R0789 Other chest pain: Secondary | ICD-10-CM

## 2023-03-18 DIAGNOSIS — I10 Essential (primary) hypertension: Secondary | ICD-10-CM | POA: Diagnosis not present

## 2023-03-18 HISTORY — DX: Abdominal aortic aneurysm, without rupture, unspecified: I71.40

## 2023-03-18 LAB — CBC
HCT: 40.3 % (ref 36.0–46.0)
Hemoglobin: 12.9 g/dL (ref 12.0–15.0)
MCH: 28.8 pg (ref 26.0–34.0)
MCHC: 32 g/dL (ref 30.0–36.0)
MCV: 90 fL (ref 80.0–100.0)
Platelets: 389 10*3/uL (ref 150–400)
RBC: 4.48 MIL/uL (ref 3.87–5.11)
RDW: 15.8 % — ABNORMAL HIGH (ref 11.5–15.5)
WBC: 5.8 10*3/uL (ref 4.0–10.5)
nRBC: 0 % (ref 0.0–0.2)

## 2023-03-18 LAB — BASIC METABOLIC PANEL
Anion gap: 7 (ref 5–15)
BUN: 20 mg/dL (ref 6–20)
CO2: 25 mmol/L (ref 22–32)
Calcium: 8.9 mg/dL (ref 8.9–10.3)
Chloride: 104 mmol/L (ref 98–111)
Creatinine, Ser: 0.86 mg/dL (ref 0.44–1.00)
GFR, Estimated: >60 mL/min (ref >60)
Glucose, Bld: 96 mg/dL (ref 70–99)
Potassium: 3.9 mmol/L (ref 3.5–5.1)
Sodium: 136 mmol/L (ref 135–145)

## 2023-03-18 LAB — TROPONIN I (HIGH SENSITIVITY)
Troponin I (High Sensitivity): 4 ng/L (ref <18)
Troponin I (High Sensitivity): 4 ng/L (ref <18)

## 2023-03-18 LAB — D-DIMER, QUANTITATIVE: D-Dimer, Quant: <0.27 ug/mL-FEU (ref 0.00–0.50)

## 2023-03-18 MED ORDER — HYDRALAZINE HCL 20 MG/ML IJ SOLN
5.0000 mg | Freq: Once | INTRAMUSCULAR | Status: AC
  Administered 2023-03-18: 5 mg via INTRAVENOUS
  Filled 2023-03-18: qty 1

## 2023-03-18 MED ORDER — AMLODIPINE BESYLATE 5 MG PO TABS
5.0000 mg | ORAL_TABLET | Freq: Once | ORAL | Status: AC
  Administered 2023-03-18: 5 mg via ORAL
  Filled 2023-03-18: qty 1

## 2023-03-18 MED ORDER — AMLODIPINE BESYLATE 5 MG PO TABS: 5.0000 mg | ORAL_TABLET | Freq: Every day | ORAL | 2 refills | Status: AC

## 2023-03-18 MED ORDER — IOHEXOL 350 MG/ML SOLN
75.0000 mL | Freq: Once | INTRAVENOUS | Status: AC | PRN
  Administered 2023-03-18: 75 mL via INTRAVENOUS

## 2023-03-18 MED ORDER — ACETAMINOPHEN 500 MG PO TABS
1000.0000 mg | ORAL_TABLET | Freq: Once | ORAL | Status: AC
  Administered 2023-03-18: 1000 mg via ORAL
  Filled 2023-03-18: qty 2

## 2023-03-18 NOTE — Discharge Instructions (Signed)
Thank you for coming to [facility] Emergency Department. You were seen for chest pain, high blood pressure. We did an exam, labs, and imaging, and these showed no acute findings. Please take your blood pressure medicine as prescribed. You may need to have additional blood pressure medicines prescribed. Please follow up with your primary care provider within 1 week.   Do not hesitate to return to the ED or call 911 if you experience: -Worsening symptoms -Chest pain, shortness of breath -Lightheadedness, passing out -Fevers/chills -Anything else that concerns you

## 2023-03-18 NOTE — ED Triage Notes (Addendum)
Patient having left side chest pain today. She is having  left arm pain and some shortness of breath. Patient hypertensive in triage. She stated she has not been taking her meds for months.

## 2023-03-18 NOTE — ED Provider Notes (Signed)
[place] [facility] Provider Note   CSN: [id number] Arrival date & time: 03/18/23  1719     History  Chief Complaint  Patient presents with   Chest Pain   Hypertension    Kathleen Drake is a 57 y.o. female with ascending aortic aneurysm, HTN, HLD, MDD who presents with left sided chest pain today. She is having  left arm pain and some shortness of breath.  Started when she was at rest.  Worse with exertion as well as with deep breaths.  Patient hypertensive to greater than 200 systolic in triage.  She is supposed to be taking amlodipine 5 mg but has not taken in months.  She is very concerned about her aortic aneurysm that was diagnosed in May of last year and requests a CT of her aneurysm today.  Denies any nausea vomiting, abdominal pain, numbness tingling, headache, lightheadedness, lower extremity edema.  Does not take a blood thinner.  Denies any recent hospitalizations or surgeries, travel.  Denies any history of DVT or PE.   Chest Pain Hypertension Associated symptoms include chest pain.       Home Medications Prior to Admission medications   Medication Sig Start Date End Date Taking? Authorizing Provider  amLODipine (NORVASC) 5 MG tablet Take 1 tablet (5 mg total) by mouth daily. 03/18/23   Loetta Rough, MD  HYDROcodone-acetaminophen (NORCO/VICODIN) 5-325 MG tablet Take 1 tablet by mouth every 4 (four) hours as needed. 04/20/17   Jacalyn Lefevre, MD  ibuprofen (ADVIL,MOTRIN) 600 MG tablet Take 1 tablet (600 mg total) by mouth every 6 (six) hours as needed. 04/20/17   Jacalyn Lefevre, MD  naproxen (NAPROSYN) 500 MG tablet Take 1 tablet (500 mg total) by mouth 2 (two) times daily with a meal. 06/28/21   Rancour, Jeannett Senior, MD      Allergies    Patient has no known allergies.    Review of Systems   Review of Systems  Cardiovascular:  Positive for chest pain.   A 10 point review of systems was performed and is negative unless  otherwise reported in HPI.  Physical Exam Updated Vital Signs BP (!) 167/112   Pulse 67   Temp (!) 96.6 F (35.9 C)   Resp (!) 22   Ht 5\' 2"  (1.575 m)   Wt 74.8 kg   LMP 07/30/2014   SpO2 100%   BMI 30.18 kg/m  Physical Exam General: Normal appearing female, lying in bed.  HEENT: Sclera anicteric, MMM, trachea midline.  Cardiology: RRR, no murmurs/rubs/gallops. BL radial and DP pulses equal bilaterally.  Resp: Normal respiratory rate and effort. CTAB, no wheezes, rhonchi, crackles.  Abd: Soft, non-tender, non-distended. No rebound tenderness or guarding.  GU: Deferred. MSK: No peripheral edema or signs of trauma. Extremities without deformity or TTP. No cyanosis or clubbing. Skin: warm, dry. Neuro: A&Ox4, CNs II-XII grossly intact. MAEs. Sensation grossly intact.  Psych: Normal mood and affect.   ED Results / Procedures / Treatments   Labs (all labs ordered are listed, but only abnormal results are displayed) Labs Reviewed  CBC - Abnormal; Notable for the following components:      Result Value   RDW 15.8 (*)    All other components within normal limits  BASIC METABOLIC PANEL  D-DIMER, QUANTITATIVE (NOT AT [facility])  TROPONIN I (HIGH SENSITIVITY)  TROPONIN I (HIGH SENSITIVITY)    EKG EKG Interpretation Date/Time:  Sunday March 18 2023 17:32:09 EDT Ventricular Rate:  58 PR Interval:  164 QRS Duration:  72 QT Interval:  410 QTC Calculation: 402 R Axis:   41  Text Interpretation: Sinus bradycardia Possible Left atrial enlargement No significant change from prior Confirmed by Vivi Barrack [phone]) on 03/18/2023 5:49:07 PM  Radiology CT Angio Chest Aorta W and/or Wo Contrast  Result Date: 03/18/2023 CLINICAL DATA:  Acute aortic syndrome suspected EXAM: CT ANGIOGRAPHY CHEST WITH CONTRAST TECHNIQUE: Multidetector CT imaging of the chest was performed using the standard protocol during bolus administration of intravenous contrast. Multiplanar CT image reconstructions and MIPs  were obtained to evaluate the vascular anatomy. RADIATION DOSE REDUCTION: This exam was performed according to the departmental dose-optimization program which includes automated exposure control, adjustment of the mA and/or kV according to patient size and/or use of iterative reconstruction technique. CONTRAST:  75mL OMNIPAQUE IOHEXOL 350 MG/ML SOLN COMPARISON:  None Available. FINDINGS: Cardiovascular: Normal caliber abdominal aorta. No evidence of aortic dissection or intramural hematoma. Evaluation of the aortic root is somewhat limited due to cardiac motion. No suspicious filling defects of the central pulmonary arteries. Evaluation of the segmental and subsegmental pulmonary arteries limited due to contrast timing. No coronary artery calcifications. Mediastinum/Nodes: Esophagus and thyroid are unremarkable. No enlarged lymph nodes seen in the chest. Lungs/Pleura: Central airways are patent. No consolidation, pleural effusion or pneumothorax. Small solid pulmonary nodule of the right middle lobe measuring 4 mm on series 6, image 27. Upper Abdomen: Low-attenuation lesion of the posterior right lobe of the liver located on series 5, image 96, likely a simple cyst. No acute abnormality. Musculoskeletal: No chest wall abnormality. No acute or significant osseous findings. Review of the MIP images confirms the above findings. IMPRESSION: 1. No evidence of acute aortic syndrome. 2. No evidence of central pulmonary embolus, evaluation of the distal pulmonary arteries is limited due to contrast timing. 3. No acute airspace opacity. 4. Small solid pulmonary nodule of the right middle lobe measuring 4 mm. No follow-up needed if patient is low-risk.This recommendation follows the consensus statement: Guidelines for Management of Incidental Pulmonary Nodules Detected on CT Images: From the Fleischner Society 2017; Radiology 2017; 284:228-243. Electronically Signed   By: Allegra Lai M.D.   On: 03/18/2023 19:49   DG  Chest 2 View  Result Date: 03/18/2023 CLINICAL DATA:  Chest pain EXAM: CHEST - 2 VIEW COMPARISON:  11/12/2021 FINDINGS: Heart and mediastinal contours are within normal limits. No focal opacities or effusions. No acute bony abnormality. IMPRESSION: No active cardiopulmonary disease. Electronically Signed   By: Charlett Nose M.D.   On: 03/18/2023 18:59    Procedures Procedures    Medications Ordered in ED Medications  hydrALAZINE (APRESOLINE) injection 5 mg (5 mg Intravenous Given 03/18/23 1748)  acetaminophen (TYLENOL) tablet 1,000 mg (1,000 mg Oral Given 03/18/23 1947)  iohexol (OMNIPAQUE) 350 MG/ML injection 75 mL (75 mLs Intravenous Contrast Given 03/18/23 1938)  hydrALAZINE (APRESOLINE) injection 5 mg (5 mg Intravenous Given 03/18/23 2003)  amLODipine (NORVASC) tablet 5 mg (5 mg Oral Given 03/18/23 2027)    ED Course/ Medical Decision Making/ A&P                          Medical Decision Making Amount and/or Complexity of Data Reviewed Labs: ordered. Decision-making details documented in ED Course. Radiology: ordered. Decision-making details documented in ED Course.  Risk OTC drugs. Prescription drug management.    This patient presents to the ED for concern of CP, SOB; this involves an extensive number of treatment options,  and is a complaint that carries with it a high risk of complications and morbidity.  I considered the following differential and admission for this acute, potentially life threatening condition.   MDM:    DDX for chest pain includes but is not limited to: Consider hypertensive emergency for patient's chest pain.  She is given hydralazine 5 mg IV and on reevaluation, her pain is improved from a 6 out of 10 to a 4 out of 10.  She has not been taking her medicine and I discussed with patient that she will need to be compliant on her amlodipine, which will only lessen her risk for acute aortic complications. Will treat her BP initially with hydralazine and also give her  home BP med amlodipine and reassess response.  Also consider ACS/arrhythmia,  PE, acute aortic syndrome, pericarditis, GERD/PUD/gastritis, or musculoskeletal pain.  Patient is very concerned about her ascending aortic aneurysm and given her hypotension and, she requests a CT of the aneurysm and I do not think it is unreasonable to evaluate for worsening aneurysm or dissection.  EKG does not demonstrate any signs of pericarditis. Patient cannot PERC out based on age, but will minimal risk factors for PE. No abdominal pain and no c/f biliary disease.   Clinical Course as of 03/18/23 2204  Sun Mar 18, 2023  1859 BMP, CBC, and initial trop wnl [HN]  1859 BP after 5 mg IV hydralazine now 186/93 [HN]  2013 CT Angio Chest Aorta W and/or Wo Contrast 1. No evidence of acute aortic syndrome. 2. No evidence of central pulmonary embolus, evaluation of the distal pulmonary arteries is limited due to contrast timing. 3. No acute airspace opacity. 4. Small solid pulmonary nodule of the right middle lobe measuring 4 mm. No follow-up needed if patient is low-risk   [HN]  2039 Troponin I (High Sensitivity): 4 Flat, neg [HN]  2130 BP(!): 167/112 BP improved.  [HN]  2202 CP has resolved, dimer neg, CT chest neg. Aortic aneurysm stable. Pt very reassured and CP has resolved.  Will diagnose hypertensive urgency as patient has no elevated troponin, pulmonary edema, AKI or any evidence of endorgan dysfunction from her hypertension.  Heart score low. Can f/u with PCP and she is instructed to take her blood pressure medicine every day.  I educated the patient about the deleterious effects of hypertension including stroke, MI, as well as acute aortic syndrome, and patient reports understanding.  Will send amlodipine 5 mg p.o. daily to her pharmacy as she states she is out at home. [HN]    Clinical Course User Index [HN] Loetta Rough, MD    Labs: I Ordered, and personally interpreted labs.  The pertinent results  include:  those listed above  Imaging Studies ordered: I ordered imaging studies including CT chest aorta I independently visualized and interpreted imaging. I agree with the radiologist interpretation  Additional history obtained from chart review.   Cardiac Monitoring: The patient was maintained on a cardiac monitor.  I personally viewed and interpreted the cardiac monitored which showed an underlying rhythm of: NSR  Reevaluation: After the interventions noted above, I reevaluated the patient and found that they have :resolved  Social Determinants of Health: Patient lives independently   Disposition:  DC w/ discharge instructions/return precautions. All questions answered to patient's satisfaction.    Co morbidities that complicate the patient evaluation  Past Medical History:  Diagnosis Date   AAA (abdominal aortic aneurysm) (HCC)    Hypertension    Pt denies,  is not treated     Medicines Meds ordered this encounter  Medications   hydrALAZINE (APRESOLINE) injection 5 mg   acetaminophen (TYLENOL) tablet 1,000 mg   iohexol (OMNIPAQUE) 350 MG/ML injection 75 mL   hydrALAZINE (APRESOLINE) injection 5 mg   amLODipine (NORVASC) tablet 5 mg   amLODipine (NORVASC) 5 MG tablet    Sig: Take 1 tablet (5 mg total) by mouth daily.    Dispense:  30 tablet    Refill:  2    I have reviewed the patients home medicines and have made adjustments as needed  Problem List / ED Course: Problem List Items Addressed This Visit   None Visit Diagnoses     Atypical chest pain    -  Primary   Hypertensive urgency       Relevant Medications   hydrALAZINE (APRESOLINE) injection 5 mg (Completed)   hydrALAZINE (APRESOLINE) injection 5 mg (Completed)   amLODipine (NORVASC) tablet 5 mg (Completed)   amLODipine (NORVASC) 5 MG tablet                   This note was created using dictation software, which may contain spelling or grammatical errors.    Loetta Rough,  MD 03/18/23 [phone]

## 2023-03-18 NOTE — ED Notes (Signed)
Patient transported to CT 

## 2023-05-08 IMAGING — DX DG KNEE COMPLETE 4+V*L*
4 series · 4 of 4 positions shown · non-contrast
Comparison: None.

CLINICAL DATA: Left knee pain.

EXAM:
LEFT KNEE - COMPLETE 4+ VIEW

[knee ap]
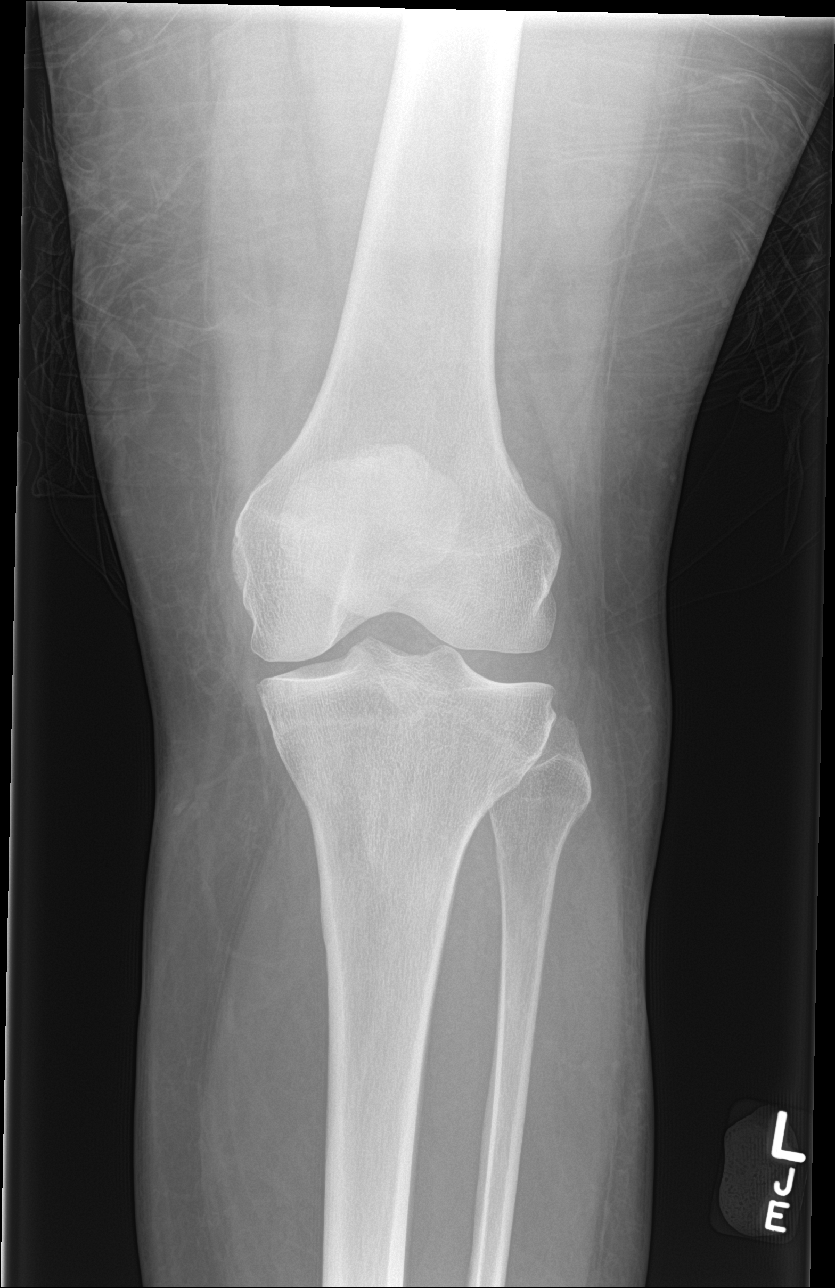

[knee tunnel]
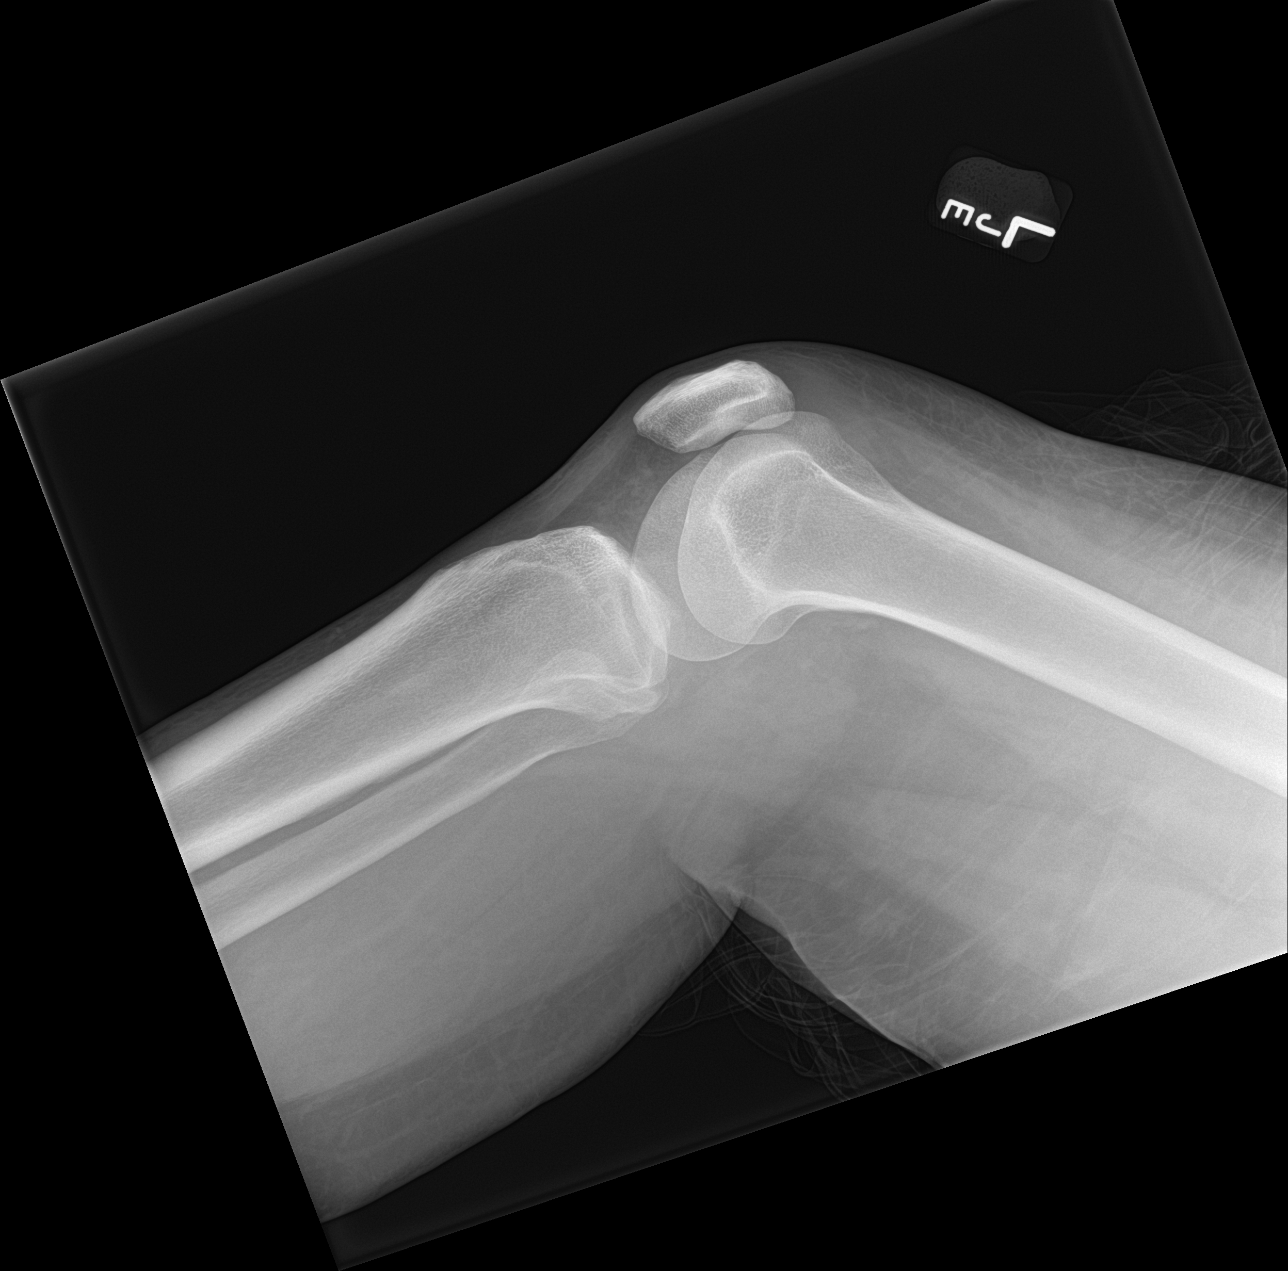

[knee obl (1 of 2)]
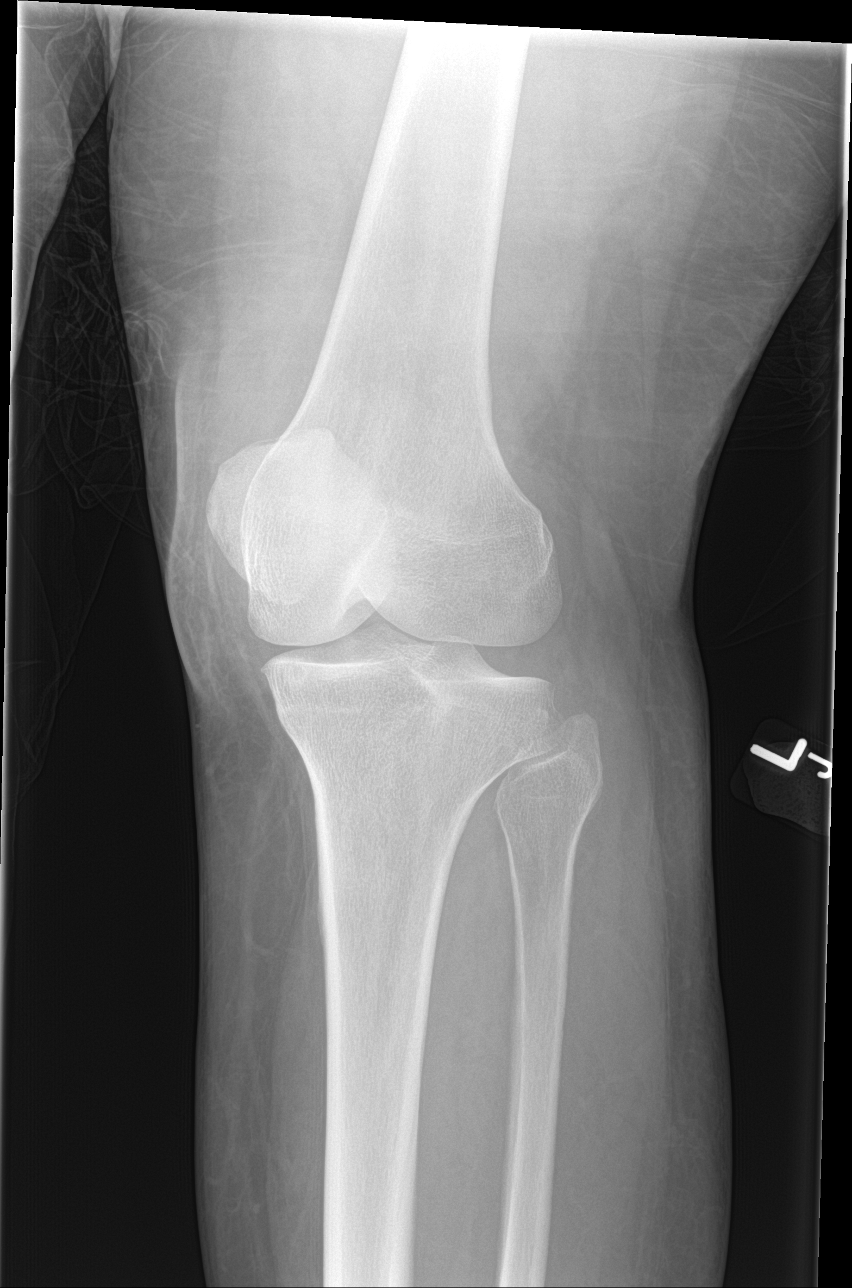

[knee obl (2 of 2)]
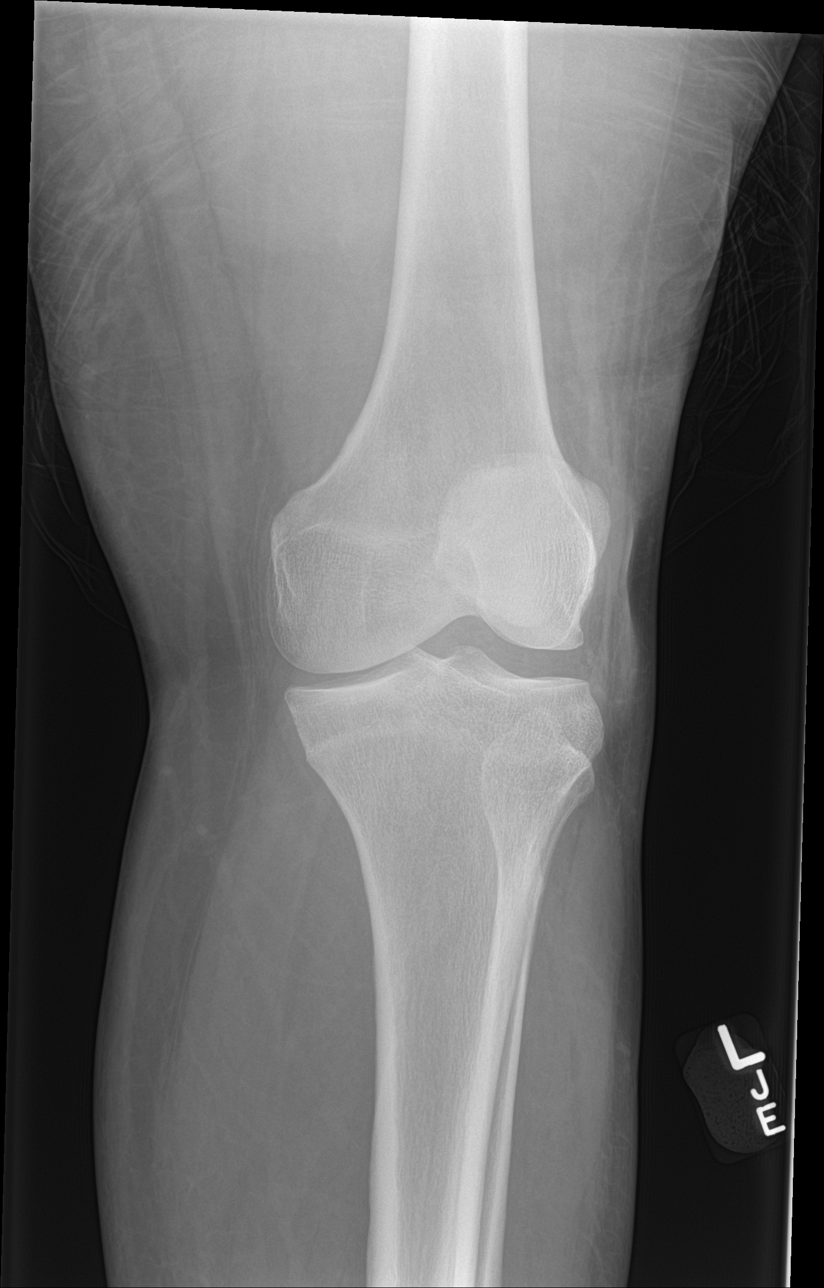

[4 of 4 positions shown; findings below may reference images not displayed]

FINDINGS: No evidence of fracture, dislocation, or joint effusion. No evidence
of arthropathy or other focal bone abnormality. Soft tissues are
unremarkable.
IMPRESSION: Negative.

## 2023-05-08 IMAGING — CT CT HEAD W/O CM
3 series · 16 of 47 positions shown, 19 images · non-contrast
Comparison: None.

CLINICAL DATA: Neuro deficit, acute stroke suspected.

EXAM:
CT HEAD WITHOUT CONTRAST
TECHNIQUE: Contiguous axial images were obtained from the base of the skull
through the vertex without intravenous contrast.

[Series 2: head wo · axial · 0.42mm/px · z∈[+214,+354]mm · 10 of 34 slices shown, 13 images]
[im 3/34  brain]
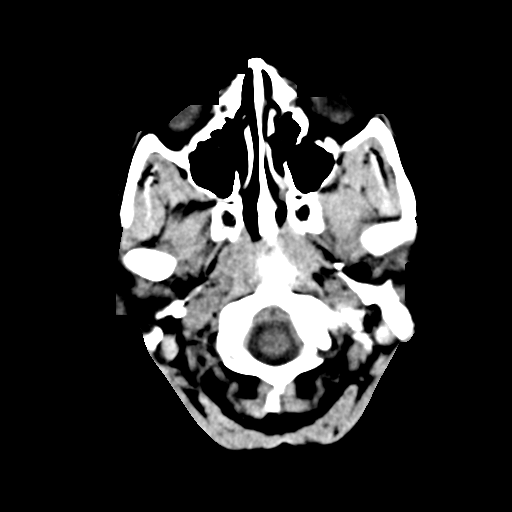
[im 3/34  bone]
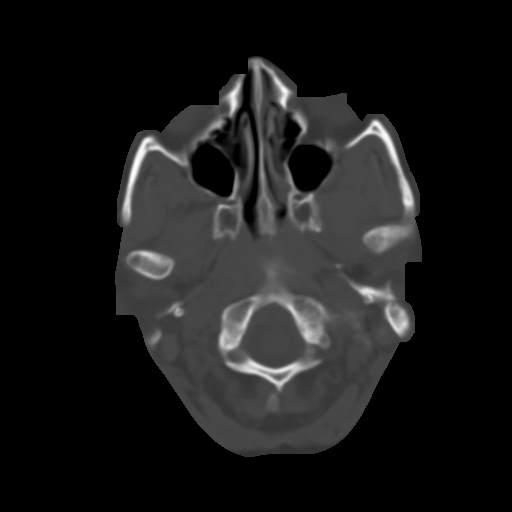
[im 6/34  brain]
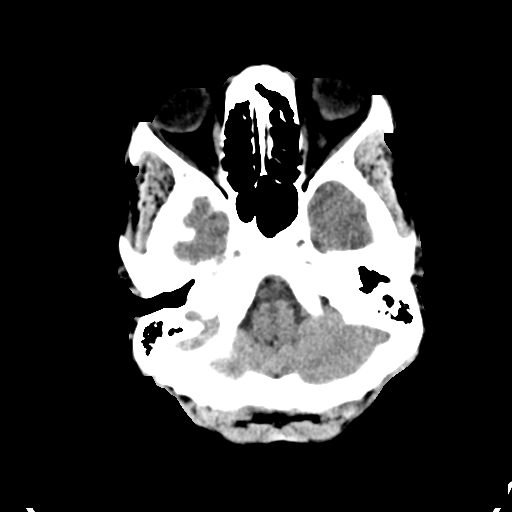
[im 10/34  brain]
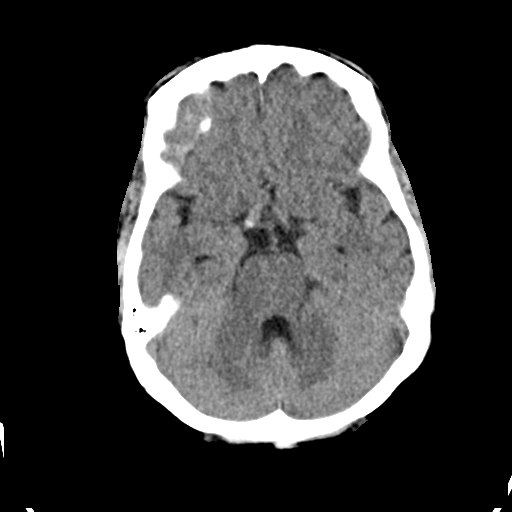
[im 12/34  brain]
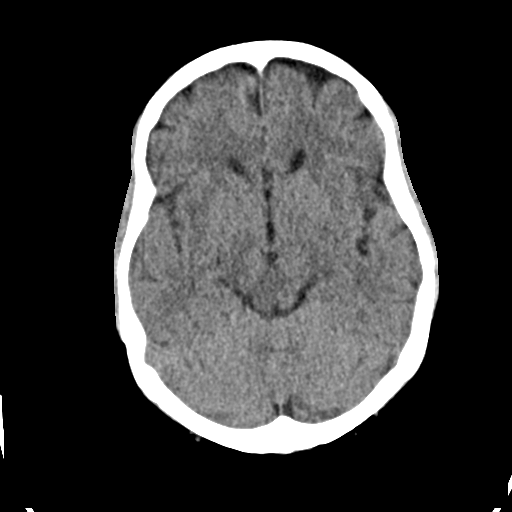
[im 15/34  brain]
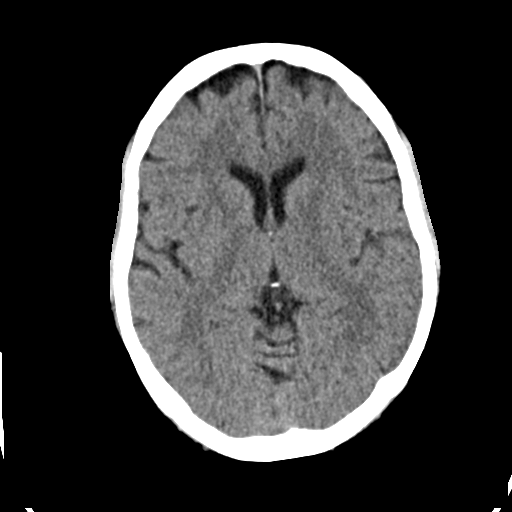
[im 15/34  bone]
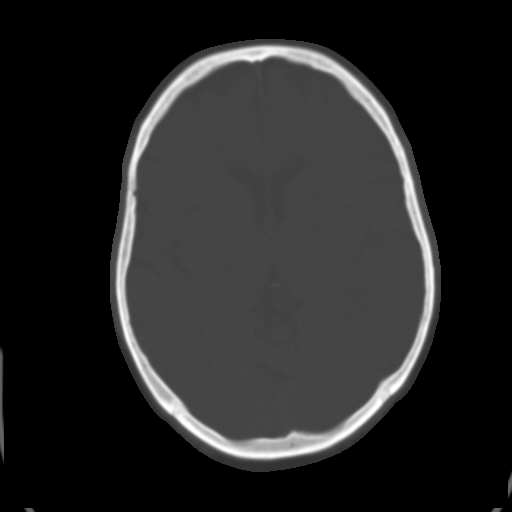
[im 19/34  brain]
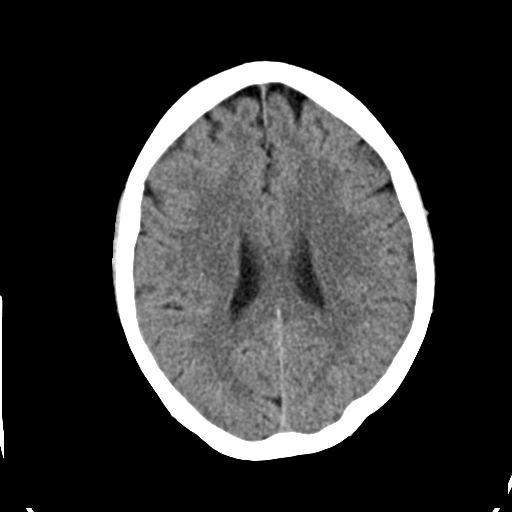
[im 22/34  brain]
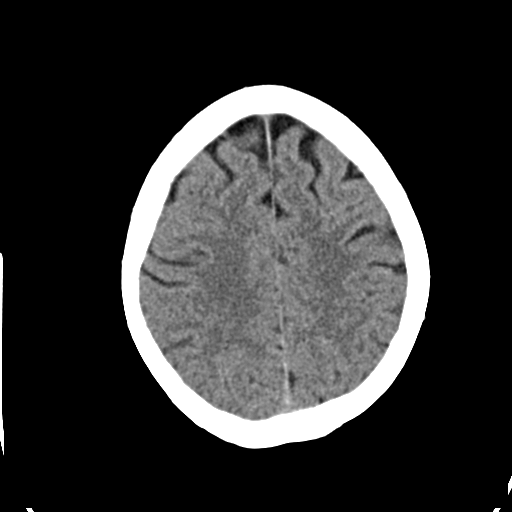
[im 26/34  brain]
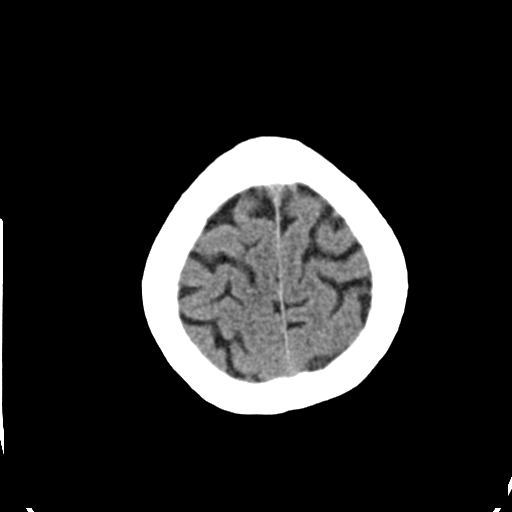
[im 28/34  brain]
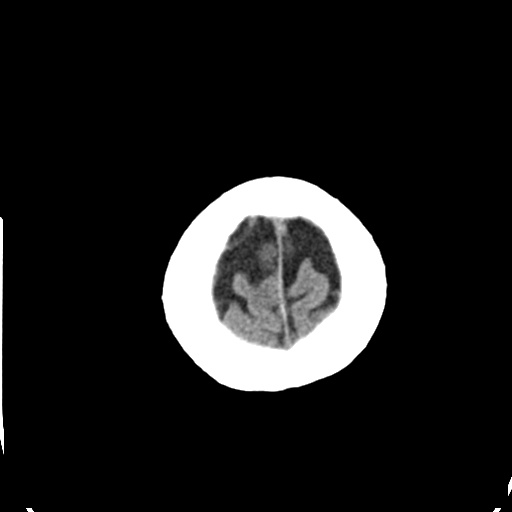
[im 28/34  bone]
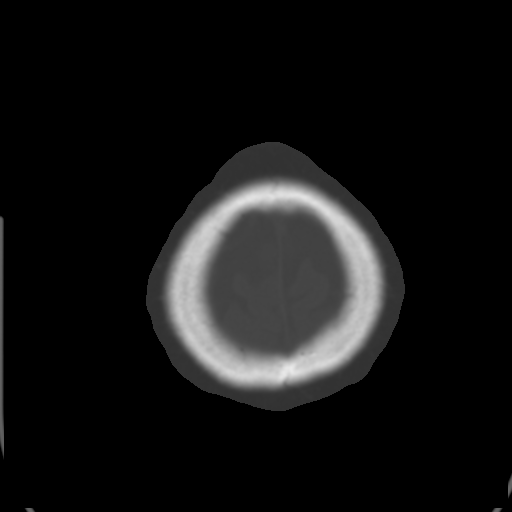
[im 31/34  brain]
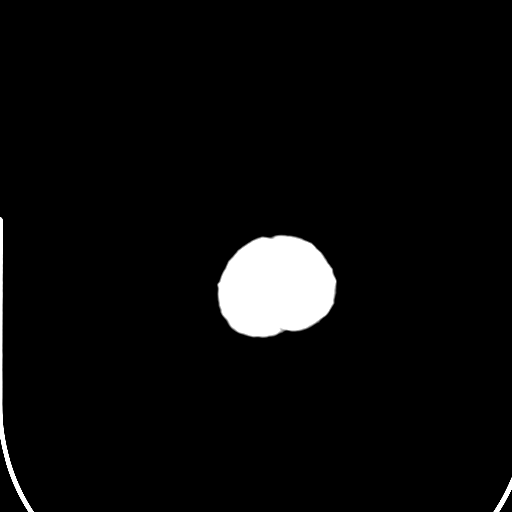

[Series 4: coronal soft · coronal · 0.33mm/px · 3 of 67 slices shown]
[im 23/67  brain]
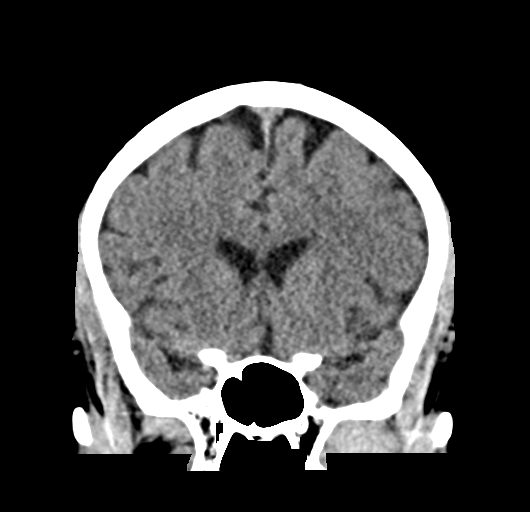
[im 30/67  brain]
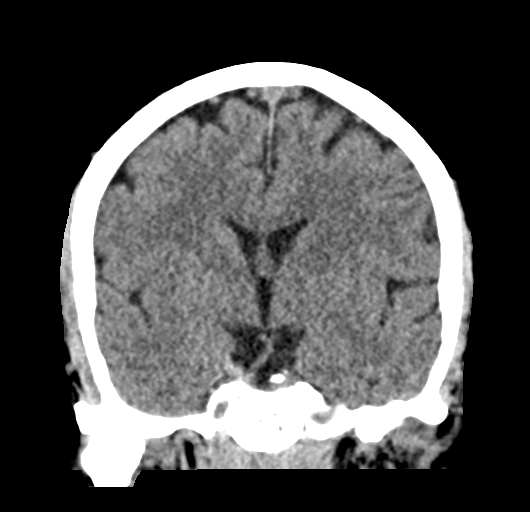
[im 37/67  brain]
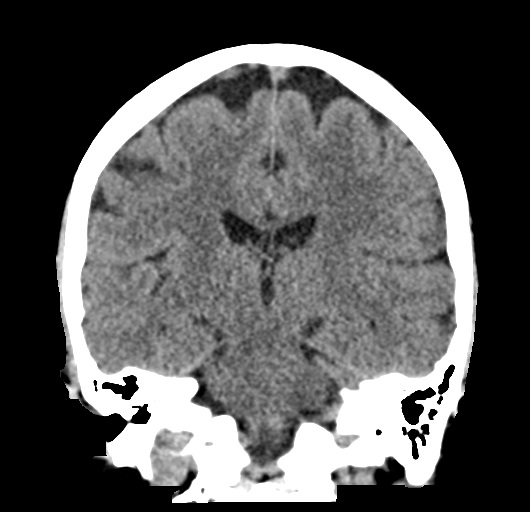

[Series 5: sag soft · sagittal · 0.34mm/px · 3 of 59 slices shown]
[im 20/59  brain]
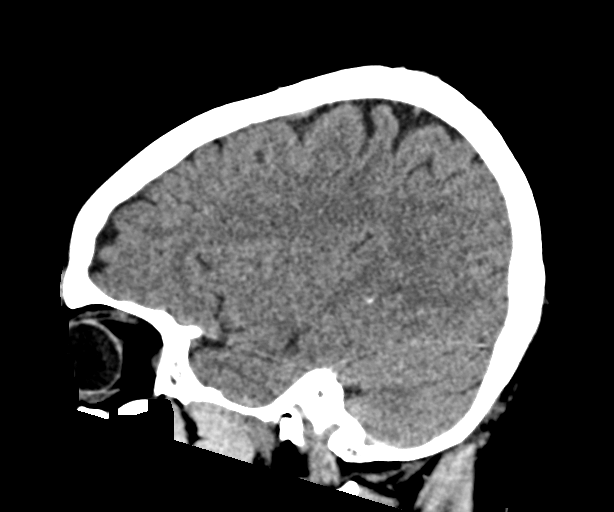
[im 30/59  brain]
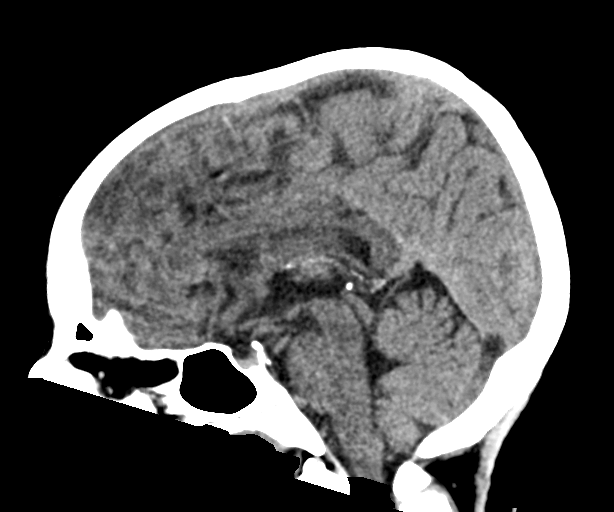
[im 39/59  brain]
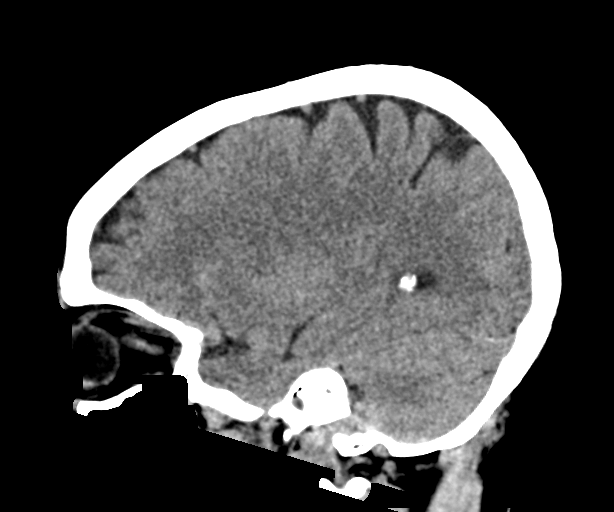

[16 of 47 positions shown; findings below may reference images not displayed]

FINDINGS: Brain: No evidence of acute large vascular territory infarction,
hemorrhage, hydrocephalus, extra-axial collection or mass
lesion/mass effect.

Vascular: No hyperdense vessel. Atherosclerotic calcifications of
the internal carotid and vertebral arteries at the skull base.

Skull: Normal. Negative for fracture or focal lesion.

Sinuses/Orbits: Visualized portions of the paranasal sinuses mastoid
air cells are predominantly clear. Orbits are unremarkable.

Other: None.
IMPRESSION: No acute intracranial abnormality.

## 2023-05-08 IMAGING — CT CT ANGIO CHEST-ABD-PELV FOR DISSECTION W/ AND WO/W CM
2 of 9 series · 14 of 46 positions shown, 16 images · IV contrast (Omnipaque)
Comparison: 04/09/2021

CLINICAL DATA: Chest pain on inspiration for 2 days, history of
thoracic aortic aneurysm, follow-up exam

EXAM:
CT ANGIOGRAPHY CHEST, ABDOMEN AND PELVIS
TECHNIQUE: Non-contrast CT of the chest was initially obtained.

[Series 5: axial arterial · axial · arterial · 0.81mm/px · z∈[-426,+102]mm · 11 of 198 slices shown, 13 images]
[im 11/198  soft-tissue]
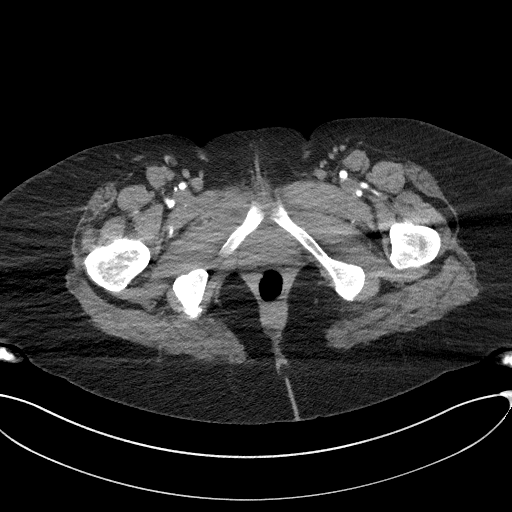
[im 11/198  bone]
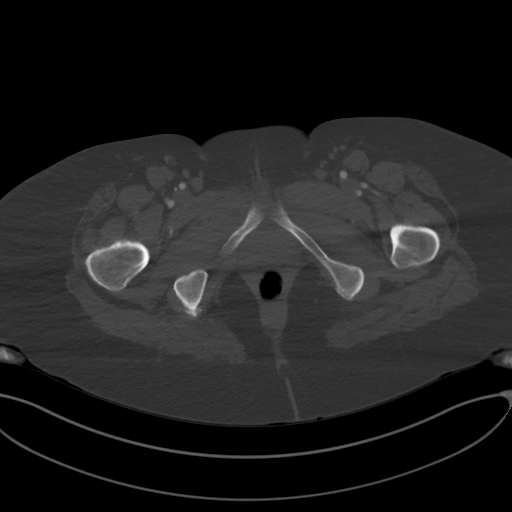
[im 32/198  soft-tissue]
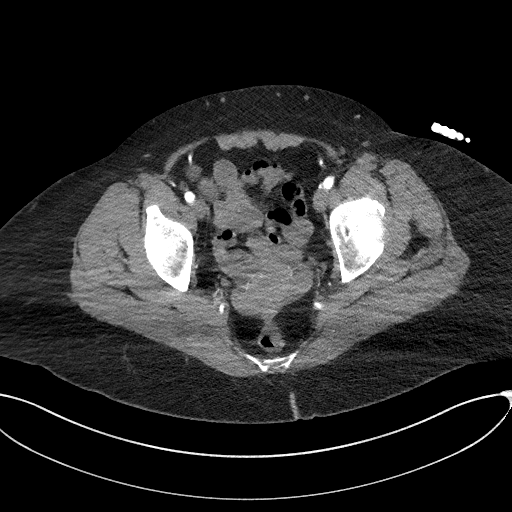
[im 52/198  soft-tissue]
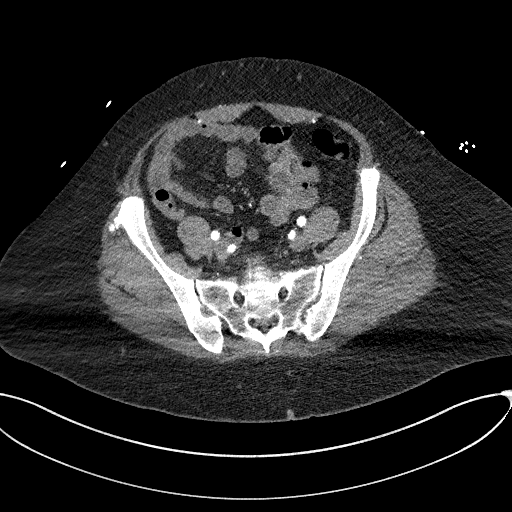
[im 63/198  soft-tissue]
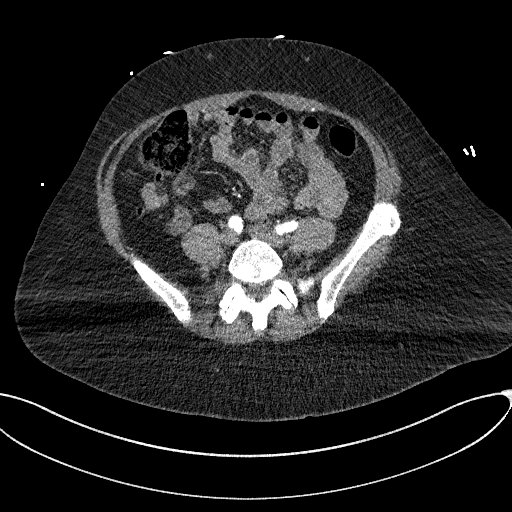
[im 83/198  soft-tissue]
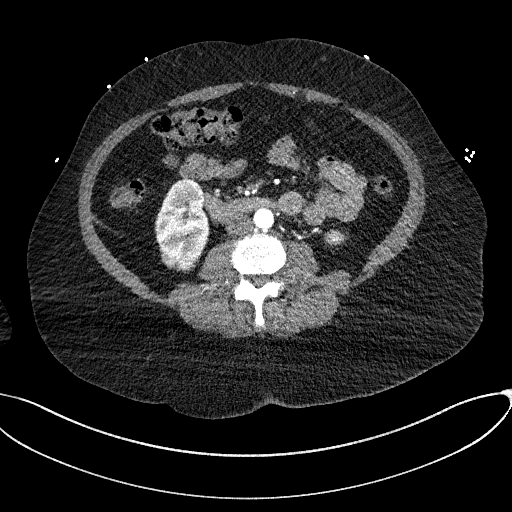
[im 104/198  soft-tissue]
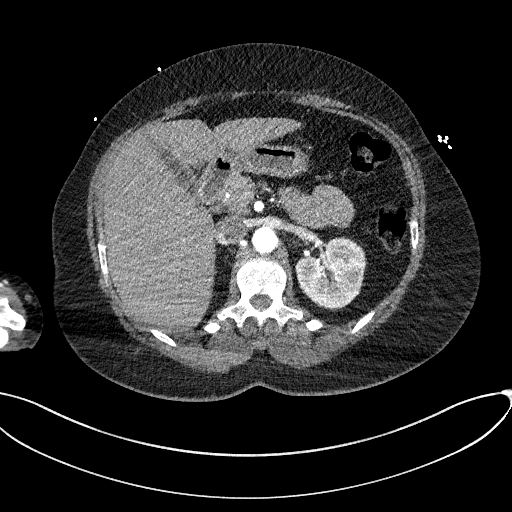
[im 115/198  soft-tissue]
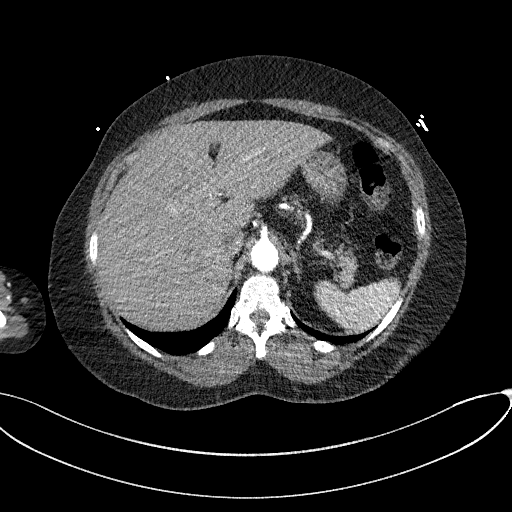
[im 135/198  soft-tissue]
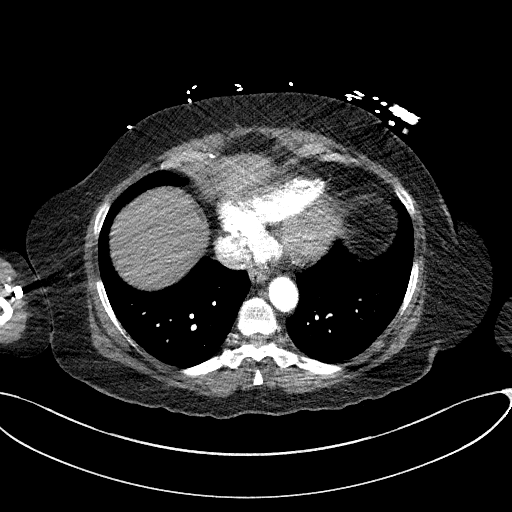
[im 146/198  soft-tissue]
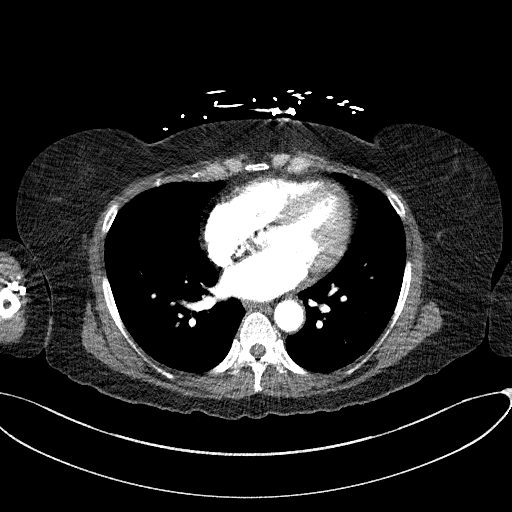
[im 146/198  bone]
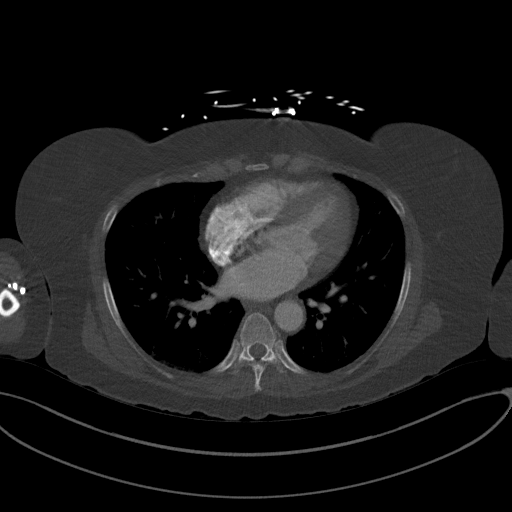
[im 166/198  soft-tissue]
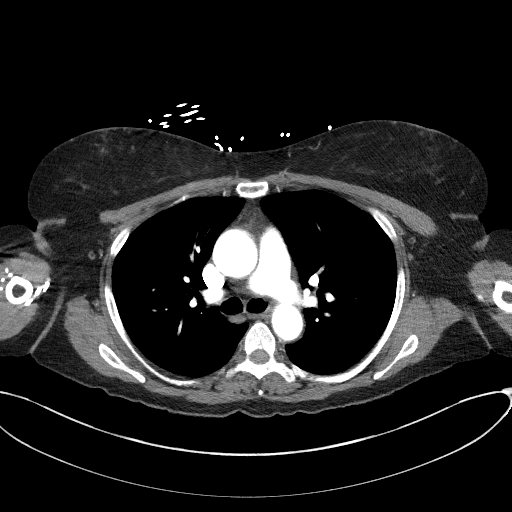
[im 187/198  soft-tissue]
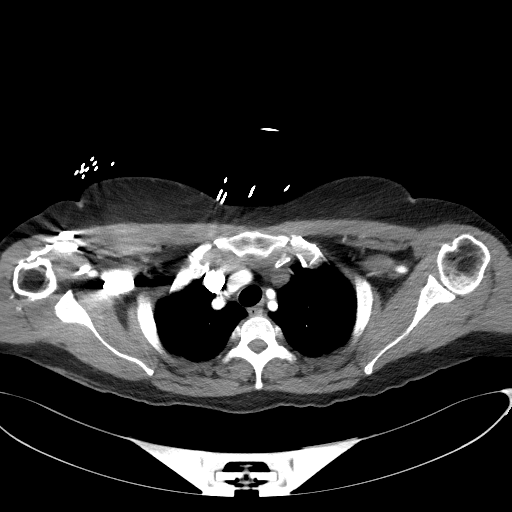

[Series 8: coronals · coronal · 0.93mm/px · 3 of 144 slices shown]
[im 36/144  soft-tissue]
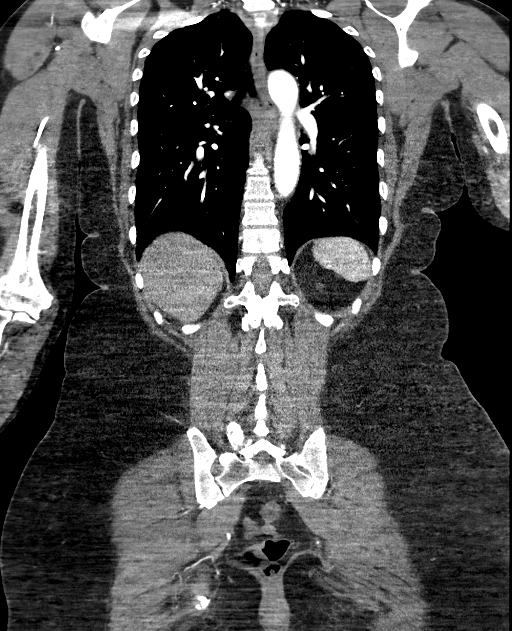
[im 72/144  soft-tissue]
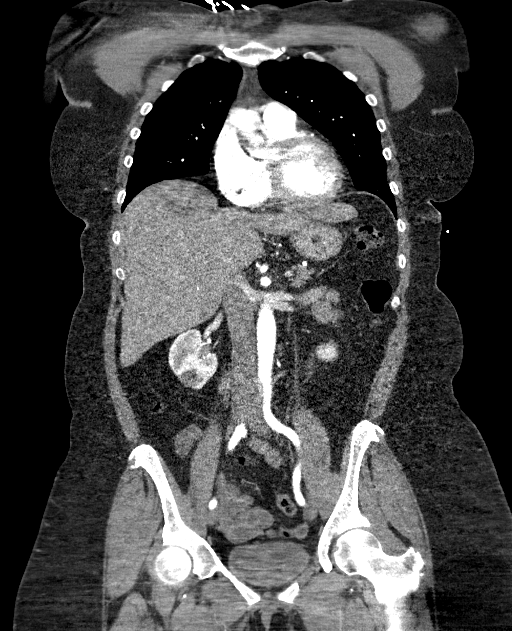
[im 108/144  soft-tissue]
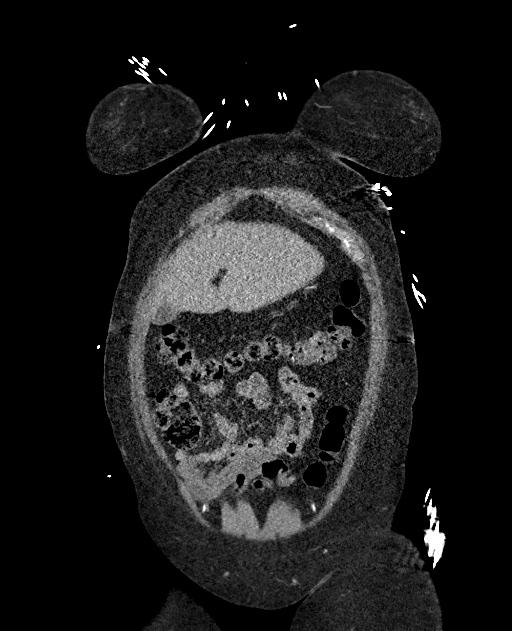

[14 of 46 positions shown; findings below may reference images not displayed]

Multidetector CT imaging through the chest, abdomen and pelvis was
performed using the standard protocol during bolus administration of
intravenous contrast. Multiplanar reconstructed images and MIPs were
obtained and reviewed to evaluate the vascular anatomy.

CONTRAST:  100mL OMNIPAQUE IOHEXOL 350 MG/ML SOLN
FINDINGS: CTA CHEST FINDINGS

Cardiovascular: Initial precontrast images were obtained and reveal
no hyperdense crescent to suggest acute aortic injury. Post-contrast
images demonstrate the thoracic aorta to be mildly prominent
measuring 3.9 cm in greatest dimension. This is stable in appearance
from the prior exam. No findings to suggest dissection are seen. No
cardiac enlargement is seen. No coronary calcifications are noted.
No pulmonary emboli are seen.

Mediastinum/Nodes: Thoracic inlet is within normal limits. No
sizable hilar or mediastinal adenopathy is noted. The esophagus as
visualized is within normal limits.

Lungs/Pleura: Lungs are well aerated bilaterally. No focal
infiltrate or sizable effusion is seen. Stable 4 mm right middle
lobe nodule is noted. This is stable dating back to 3581 and
consistent with a benign etiology. No further follow-up is
recommended. Stable thin walled cysts are noted in the lung bases.

Musculoskeletal: No chest wall abnormality. No acute or significant
osseous findings.

Review of the MIP images confirms the above findings.

CTA ABDOMEN AND PELVIS FINDINGS

VASCULAR

Aorta: Normal caliber aorta without aneurysm, dissection, vasculitis
or significant stenosis.

Celiac: Patent without evidence of aneurysm, dissection, vasculitis
or significant stenosis.

SMA: Patent without evidence of aneurysm, dissection, vasculitis or
significant stenosis.

Renals: Both renal arteries are patent without evidence of aneurysm,
dissection, vasculitis, fibromuscular dysplasia or significant
stenosis.

IMA: Patent without evidence of aneurysm, dissection, vasculitis or
significant stenosis.

Inflow: Iliacs are within normal limits.

Veins: No specific venous abnormality is seen.

Review of the MIP images confirms the above findings.

NON-VASCULAR

Hepatobiliary: No focal liver abnormality is seen. No gallstones,
gallbladder wall thickening, or biliary dilatation.

Pancreas: Unremarkable. No pancreatic ductal dilatation or
surrounding inflammatory changes.

Spleen: Normal in size without focal abnormality.

Adrenals/Urinary Tract: Adrenal glands are within normal limits.
Kidneys demonstrate a normal enhancement pattern bilaterally. Cyst
is noted in the lower pole of the right kidney now measuring 13 mm
in size increased from the prior exam. No obstructive changes are
noted. The bladder is partially distended.

Stomach/Bowel: Scattered diverticular change of the colon is noted
without evidence of diverticulitis. No obstructive changes of the
colon are seen. The appendix is well visualized and within normal
limits. The small bowel and stomach are unremarkable.

Lymphatic: Sizable lymphadenopathy is seen.

Reproductive: Uterus and bilateral adnexa are unremarkable.

Other: No abdominal wall hernia or abnormality. No abdominopelvic
ascites.

Musculoskeletal: No acute or significant osseous findings.

Review of the MIP images confirms the above findings.
IMPRESSION: Stable appearing dilatation of the ascending aorta 3.9 cm on today's
exam. No evidence of dissection is seen.

Abdominal aorta and vessels are within normal limits.

Stable 4 mm right middle lobe nodule dating back to 3581 consistent
with a benign etiology. No further follow-up is recommended.

Right renal cyst.

Diverticulosis without diverticulitis.
